# Patient Record
Sex: Male | Born: 1960 | Race: White | Hispanic: No | Marital: Single | Smoking: Former smoker
Health system: Southern US, Community
[De-identification: ages and names within clinical notes are randomized; demographics above are authoritative.]

## PROBLEM LIST (undated history)

## (undated) DIAGNOSIS — E7800 Pure hypercholesterolemia, unspecified: Secondary | ICD-10-CM

## (undated) DIAGNOSIS — Z789 Other specified health status: Secondary | ICD-10-CM

## (undated) DIAGNOSIS — I10 Essential (primary) hypertension: Secondary | ICD-10-CM

## (undated) DIAGNOSIS — F1090 Alcohol use, unspecified, uncomplicated: Secondary | ICD-10-CM

## (undated) DIAGNOSIS — M1990 Unspecified osteoarthritis, unspecified site: Secondary | ICD-10-CM

## (undated) HISTORY — DX: Other specified health status: Z78.9

## (undated) HISTORY — PX: APPENDECTOMY: SHX54

## (undated) HISTORY — DX: Alcohol use, unspecified, uncomplicated: F10.90

## (undated) HISTORY — PX: HAND SURGERY: SHX662

---

## 2001-04-21 ENCOUNTER — Ambulatory Visit (HOSPITAL_COMMUNITY): Admission: RE | Admit: 2001-04-21 | Discharge: 2001-04-21 | Payer: Self-pay | Admitting: *Deleted

## 2006-04-22 ENCOUNTER — Inpatient Hospital Stay (HOSPITAL_COMMUNITY): Admission: EM | Admit: 2006-04-22 | Discharge: 2006-04-23 | Payer: Self-pay | Admitting: Family Medicine

## 2006-04-22 ENCOUNTER — Encounter (INDEPENDENT_AMBULATORY_CARE_PROVIDER_SITE_OTHER): Payer: Self-pay | Admitting: *Deleted

## 2006-04-28 ENCOUNTER — Ambulatory Visit: Payer: Self-pay | Admitting: Internal Medicine

## 2006-05-25 ENCOUNTER — Ambulatory Visit: Payer: Self-pay | Admitting: Critical Care Medicine

## 2006-06-04 ENCOUNTER — Encounter: Admission: RE | Admit: 2006-06-04 | Discharge: 2006-06-04 | Payer: Self-pay | Admitting: Internal Medicine

## 2006-09-21 ENCOUNTER — Ambulatory Visit: Payer: Self-pay | Admitting: Internal Medicine

## 2006-09-27 ENCOUNTER — Encounter (INDEPENDENT_AMBULATORY_CARE_PROVIDER_SITE_OTHER): Payer: Self-pay | Admitting: *Deleted

## 2006-09-30 ENCOUNTER — Encounter (INDEPENDENT_AMBULATORY_CARE_PROVIDER_SITE_OTHER): Payer: Self-pay | Admitting: *Deleted

## 2010-06-20 NOTE — Discharge Summary (Signed)
[name], [name]                 ACCOUNT NO.:  [id number]   MEDICAL RECORD NO.:  [id number]          PATIENT TYPE:  INP   LOCATION:  5703                         FACILITY:  [facility]   PHYSICIAN:  Angelia Mould. Derrell Lolling, M.D.DATE OF BIRTH:  [birth year]   DATE OF ADMISSION:  04/22/2006  DATE OF DISCHARGE:  04/23/2006                               DISCHARGE SUMMARY   FINAL DIAGNOSIS:  Acute suppurative appendicitis.   OPERATION PERFORMED:  Laparoscopic appendectomy.   SURGEON:  Angelia Mould. Derrell Lolling, M.D.   OPERATIVE INDICATION:  This is a 50 year old white man with no chronic  medical problems with fairly extensive tobacco abuse.  He presented with  right lower quadrant abdominal pain to the emergency room.  CT was  performed which showed appendicitis in the retrocecal location.  I was  asked to see him and found that he had history and physical findings and  CT findings consistent with acute appendicitis.  White blood cell count  was elevated to 16,800.  He was admitted for surgical intervention.   HOSPITAL COURSE:  On day of admission, the patient was taken to  operating room and underwent a laparoscopic appendectomy.  He had acute  appendicitis.  Technically, the surgery was uneventful.  During the  procedure, he required fairly high FIO2 and had some bronchospasm which  caused concern for the anesthesiologist, but this improved by the end of  the case.  A postop chest x-ray showed suboptimal inspiration, but no  acute cardiopulmonary disease.  He had no problems with oxygen  saturation.   He did well.  He progressed in his diet and activities and was  discharged on April 23, 2006.  I discussed the pulmonary problems with  him and strongly encouraged smoking cessation and asked him to follow up  with Dr. Alwyn Ren, his primary care physician, to discussed smoking  cessation and potential pulmonary problems; he said he would do that.  He was given instructions in diet and activities and  followup  appointment with me.      Angelia Mould. Derrell Lolling, M.D.  Electronically Signed     HMI/MEDQ  D:  06/18/2006  T:  06/18/2006  Job:  [id number]

## 2010-06-20 NOTE — H&P (Signed)
NAMEKENTAVIUS, Simpson                 ACCOUNT NO.:  [id number]   MEDICAL RECORD NO.:  [id number]          PATIENT TYPE:  INP   LOCATION:  1826                         FACILITY:  [facility]   PHYSICIAN:  Angelia Mould. Derrell Lolling, M.D.DATE OF BIRTH:  05-03-1960   DATE OF ADMISSION:  04/22/2006  DATE OF DISCHARGE:                              HISTORY & PHYSICAL   CHIEF COMPLAINT:  Right lower quadrant abdominal pain.   HISTORY OF PRESENT ILLNESS:  Mr. [name] is a 50 year old male patient  with no chronic medical problems.  He developed right lower quadrant  abdominal pain around 9 O'clock on April 21, 2006.  The pain became  severe enough that it prompted him to leave work around lunchtime on  that same date.  The patient went home, tried to sleep, noticed that if  he laid on his left side his pain was increased.  He had eaten breakfast  yesterday, but since that time has really not eaten.  He has had dry  heaves and low-grade fevers.  The pain persisted until this morning, so  he finally presented to the urgent care at [facility].  Because of his  clinical exam, he was sent to the ER for further evaluation.  CT scan  was performed and does show retrocecal appendicitis without evidence of  perforation or abscess.  Surgical consultation has been requested.   REVIEW OF SYSTEMS:  As above.   PAST HISTORY:  None.   PAST SURGICAL HISTORY:  None.   FAMILY HISTORY:  Noncontributory.   SOCIAL HISTORY:  The patient does smoke cigarettes since age 50, one  pack per day.  He also drinks alcoholic beverages, usually beer, about  one case over a weeks period of time.  He is married.  His wife works as  a Designer, jewellery within the [employer] system at the outpatient  clinic.   ALLERGIES:  NKDA.   CURRENT MEDICATIONS:  None.   PHYSICAL EXAMINATION:  GENERAL:  Pleasant male patient complaining of  significant right lower quadrant abdominal pain.  VITAL SIGNS:  Temperature is 97, blood pressure is  142/94, pulse is 108,  respirations are 20.  NEURO:  The patient is alert and oriented times 3.  Moves all  extremities times 4 without focal deficit.  HEENT:  Head is normocephalic.  Sclerae non-injected.  NECK:  Supple.  No lymphadenopathy.  CHEST:  Bilateral lung sounds clear to auscultation without wheezing.  Respiratory effort is non-labored.  He is on room air.  CARDIAC:  S1, S2 without rubs, murmurs, throws, or gallops.  Pulse is  slightly tachycardic, but regular.  ABDOMEN:  Soft, slightly distended.  Bowel sounds are diminished.  He is  tender focally in the right lower quadrant with guarding and minimal  rebounding.  EXTREMITIES:  Symmetrical in appearance without edema, cyanosis, or  clubbing.  Pulses are palpable.   LABORATORY DATA:  White count 16,800, neutrophils 81%, hemoglobin 16.3,  platelets 287,000.  Sodium 134, potassium 3, CO2 23, glucose 110, BUN  10, creatinine 1.  CT of the abdomen and pelvis revealed acute  retrocecal appendicitis without perforation or abscess.   IMPRESSION:  1. Acute appendicitis.  2. Volume depletion.   PLAN:  1. Admit to surgical floor NPO status.  IV fluid hydration at 150 mL      an hour.  Empiric Zosyn IV empirically to cover enteric pathogens.  2. Plan on laparoscopic appendectomy today.  Risks and benefits of      this procedure have been discussed with the patient per Dr. Derrell Lolling      and he agrees to proceed.  3. Will offer dilotid for pain and Zofran for nausea.      Allison L. Rennis Harding, N.P.      Angelia Mould. Derrell Lolling, M.D.  Electronically Signed    ALE/MEDQ  D:  04/22/2006  T:  04/22/2006  Job:  [id number]

## 2010-06-20 NOTE — Cardiovascular Report (Signed)
[place]. [facility]  Patient:    [name], [name] Visit Number: [id number] MRN: [id number]          Service Type: CAT Location: [facility] 2860 01 Attending Physician:  Daisey Must Dictated by:   Veneda Melter, M.D. [facility] Proc. Date: 04/21/01 Admit Date:  04/21/2001 Discharge Date: 04/21/2001   CC:         Nathen May, M.D., [facility]  Titus Dubin. Alwyn Ren, M.D. [facility]   Cardiac Catheterization  PROCEDURE: 1. Left heart catheterization. 2. Left ventriculogram. 3. Selective coronary angiography. 4. Perclose of the right femoral artery.  DIAGNOSES: 1. Trivial coronary artery disease by angiogram. 2. Normal left ventricular systolic function.  INDICATIONS:  [name] is a 50 year old white male with a strong family history of coronary artery disease and history of tobacco use who presents with neck discomfort.  The patient underwent stress test in which he had nonsustained ventricular tachycardia and bundle branch block.  He presents for further cardiac assessment.  DESCRIPTION OF PROCEDURE:  Informed consent was obtained.  The patient was brought to the catheterization lab.  A 6 French sheath was placed in the right femoral artery using a modified Seldinger technique.  6 [place] and JR4 catheters were then used to engage the left and right coronary arteries. Selective angiography performed in various projections using manual injections of contrast.  A 6 French pigtail catheter was advanced to the left ventricle and left ventriculogram performed using power injections of contrast.  At the termination of the case, a Perclose suture closure device was deployed to the right femoral artery until adequate hemostasis was achieved.  The patient tolerated the procedure well and was transferred to the floor in stable condition.  FINDINGS: 1. Left main trunk angiographically normal. 2. LAD.  This is a medium caliber vessel that provides two diagonal  branches.    The LAD has luminal irregularities in the midsection. 3. Left circumflex artery is a medium caliber vessel that provides small    first and third marginal branches and a large second marginal branch in the    midsection.  The left circumflex system has luminal irregularities. 4. The right coronary artery is dominant.  This is a medium caliber vessel    that provides posterior descending artery and posterior ventricular branch    of the terminal segment. The right coronary artery has luminal    irregularities in the midsection. 5. LV; normal end systolic and end diastolic dimensions.  Overall left    ventricular function is well preserved.  Ejection fraction is greater than    65%. No mitral regurgitation.  LV pressure is 120/0, aortic is 120/70,    LVEDP equals 15.  ASSESSMENT:  [name] is a 50 year old gentleman with noncritical coronary artery disease and well preserved LV function.  Continued medical therapy will be persued. Dictated by:   Veneda Melter, M.D. [facility] Attending Physician:  Daisey Must DD:  04/21/01 TD:  04/24/01 Job: [id number]

## 2010-06-20 NOTE — Op Note (Signed)
[name], [name]                 ACCOUNT NO.:  [id number]   MEDICAL RECORD NO.:  [id number]          PATIENT TYPE:  INP   LOCATION:  1826                         FACILITY:  [facility]   PHYSICIAN:  Angelia Mould. Derrell Lolling, M.D.DATE OF BIRTH:  1961/01/04   DATE OF PROCEDURE:  04/22/2006  DATE OF DISCHARGE:                               OPERATIVE REPORT   PREOPERATIVE DIAGNOSIS:  Acute appendicitis.   POSTOPERATIVE DIAGNOSIS:  Acute appendicitis.   OPERATION PERFORMED:  Laparoscopic appendectomy.   SURGEON:  Angelia Mould. Derrell Lolling, M.D.   OPERATIVE INDICATIONS:  This is a 50 year old white male who presents  with a 24-hour history of right lower quadrant abdominal pain and  nausea.  He has never had symptoms like this before.  CT scan shows  acute appendicitis with the appendix somewhat in a retrocecal location  and extending laterally.  No other abnormalities were noted on the CT.  No abscess or perforation.  He was evaluated in the emergency department  and advised to have appendectomy.  He agrees with that plan and is  brought to the operating room urgently.   OPERATIVE FINDINGS:  The patient had acute appendicitis.  The appendix  was partially retrocecal and partially lateral to the cecum.  The  ascending colon and terminal ileum looked fine.  The omentum was stuck  to this process slightly but was easy to dissect away.   OPERATIVE TECHNIQUE:  Following the induction of general endotracheal  anesthesia, a Foley catheter was placed.  The abdomen was prepped and  draped in a sterile fashion.  The patient had received intravenous  antibiotics in the emergency department.  The patient was identified as  to the correct patient and the correct procedure.  0.5% Marcaine with  epinephrine was used as a local infiltration anesthetic.  A 12 mm OptiVu  port was placed in the left rectus muscle just above the umbilicus.  This insertion was fairly smooth and atraumatic.  Pneumoperitoneum was  created.  The video camera was inserted with visualization and findings  as described above.  A 5 mm trocar was placed in the left rectus muscle  just below the umbilicus and a 12 mm trocar was placed in the left  suprapubic area.   The cecum and appendix were fairly stuck to each other laterally.  I  chose to divide the lateral peritoneal attachments and mobilize the  terminal ileum and cecum medially.  With this, I was able to identify  the tip of the appendix and peel it off of the cecum and then identified  the mesentery of the appendix.  Using the harmonic scalpel, I divided  the mesentery of the appendix and the appendiceal artery.  I did this in  steps until I had the appendix completely skeletonized and could  identify the junction of the appendix with the cecum.  I placed an Endo-  GIA stapler and placed it transversely across the appendix and the base  of the cecum, closed the stapler, held it in place for about 20 seconds,  fired it, and removed it.  The appendix was  placed in a specimen bag and  removed.   The operative field was irrigated.  There was no bleeding.  I carefully  inspected the staple line and it appeared quite secure.  I was satisfied  with this.  I evacuated all the fluid.  There was no bleeding.  I took  some of the omentum and then tucked it on top of the cecum and the  staple line.  The trocars were removed under direct vision.  There was  no bleeding from the trocar sites.  The pneumoperitoneum was released.  The skin incisions were closed with  subcuticular sutures of 4-0 Monocryl and Steri-Strips.  Clean bandages  were placed and the patient was taken to the recovery room in stable  condition.  Estimated blood loss was about 15 mL.  Complications were  none.  Sponge, needle, and instrument counts were correct.      Angelia Mould. Derrell Lolling, M.D.  Electronically Signed     HMI/MEDQ  D:  04/22/2006  T:  04/22/2006  Job:  [id number]   cc:   Charlaine Dalton. Sherene Sires,  MD, FCCP

## 2010-06-20 NOTE — Letter (Signed)
April 28, 2006    [name]. [name], M.D.  [address], [address]  [place], [place] [address]   RE:  [name], [name]  MRN:  [id number]  /  DOB:  1960/03/13   Dear [name]:   Thank you for referring [name] for evaluation of his pulmonary status.  He states that he had atelectasis at the time of the appendectomy with  apparent respiratory compromise.  Since discharge he has not smoked.  Initially he had a productive cough, but that is improving off  cigarettes.  He continues to use incentive spirometry at home with  stable tidal volumes.   He has no past history of pulmonary disease.  He had a cardiac cath  in  2003 for neck discomfort & non sustained ventricular tachycardia and  bundle branch block @ nuclear stress test.  Significantly, his father  had a heart attack in his 30s.  That catheterization showed minimal  trivial coronary artery disease.   Additionally, at that time he was found to have dyslipidemia and  nutritional changes were recommended.  Unfortunately, he did continue to  smoke and, I ate whatever, even though he is married to a  nutritionist.   At this time he has no active cardiopulmonary symptoms.  He has residual  soreness from the laparoscopic appendectomy.   He is on no medications, has no known allergies.   PHYSICAL EXAMINATION:  VITAL SIGNS:  Weight was 208, pulse 61 and  regular, respiratory rate 16, blood pressure 110/80.  CARDIAC:  He has no carotid bruits; an S4 is present without murmurs.  All pulses are intact, and he has no edema.  CHEST:  Clear to auscultation, and there is no increased work of  breathing.  ABDOMEN:  Still distended and tender.  The laparoscopic sites are well-  healed.   He did have some bilateral atelectasis on portable chest film of March  20 in the hospital.   I will recommend that he continue the incentive spirometer or blow up  balloons @ least 6 balloons per day while away at home.  I will schedule  pulmonary function  tests to establish a baseline.  Additionally, I will  schedule a follow-up chest x-ray at [name] in several weeks.   Because of his family history of diabetes and coronary artery disease  which is profound in the paternal family, I will recommend that he have  an NMR LipoProfile panel done in July after several months of ongoing  cardiovascular exercise and dietary change.  I would recommend the book  by Dr. Beckey Simpson, Eat, Drink and Be Healthy, as well as the information  in [employer]'s Flat Belly Diet.  His glucose was 110 during  hospitalization, which may reflect the IV fluids, but I will also check  an A1c in August.   He is a nonsmoker at this time but requested that I prescribe Chantix.    Sincerely,      [name]. Phillip Ren, MD,FACP,FCCP  Electronically Signed    [facility]/MedQ  DD: 04/28/2006  DT: 04/28/2006  Job #: [id number]

## 2010-12-31 ENCOUNTER — Other Ambulatory Visit: Payer: Self-pay | Admitting: Sports Medicine

## 2010-12-31 DIAGNOSIS — M25571 Pain in right ankle and joints of right foot: Secondary | ICD-10-CM

## 2011-01-02 ENCOUNTER — Ambulatory Visit
Admission: RE | Admit: 2011-01-02 | Discharge: 2011-01-02 | Disposition: A | Discharge status: Home / Self Care | Payer: BC Managed Care – PPO | Source: Ambulatory Visit | Attending: Sports Medicine | Admitting: Sports Medicine

## 2011-01-02 DIAGNOSIS — M25571 Pain in right ankle and joints of right foot: Secondary | ICD-10-CM

## 2011-03-30 ENCOUNTER — Encounter (HOSPITAL_BASED_OUTPATIENT_CLINIC_OR_DEPARTMENT_OTHER): Payer: Self-pay | Admitting: *Deleted

## 2011-03-30 ENCOUNTER — Encounter (HOSPITAL_BASED_OUTPATIENT_CLINIC_OR_DEPARTMENT_OTHER)
Admission: RE | Admit: 2011-03-30 | Discharge: 2011-03-30 | Disposition: A | Discharge status: Home / Self Care | Payer: BC Managed Care – PPO | Source: Ambulatory Visit | Attending: Orthopedic Surgery | Admitting: Orthopedic Surgery

## 2011-03-31 ENCOUNTER — Other Ambulatory Visit: Payer: Self-pay

## 2011-03-31 ENCOUNTER — Encounter (HOSPITAL_BASED_OUTPATIENT_CLINIC_OR_DEPARTMENT_OTHER)
Admission: RE | Admit: 2011-03-31 | Discharge: 2011-03-31 | Disposition: A | Discharge status: Home / Self Care | Payer: BC Managed Care – PPO | Source: Ambulatory Visit | Attending: Orthopedic Surgery | Admitting: Orthopedic Surgery

## 2011-03-31 ENCOUNTER — Encounter (HOSPITAL_BASED_OUTPATIENT_CLINIC_OR_DEPARTMENT_OTHER): Admission: RE | Admit: 2011-03-31 | Payer: BC Managed Care – PPO | Source: Ambulatory Visit

## 2011-03-31 LAB — BASIC METABOLIC PANEL
BUN: 12 mg/dL (ref 6–23)
CO2: 24 mEq/L (ref 19–32)
Calcium: 9.9 mg/dL (ref 8.4–10.5)
Creatinine, Ser: 0.75 mg/dL (ref 0.50–1.35)
GFR calc non Af Amer: >90 mL/min (ref >90)
Glucose, Bld: 108 mg/dL — ABNORMAL HIGH (ref 70–99)

## 2011-04-01 NOTE — H&P (Signed)
Yulitza Shorts/[facility] [address]   [address] [place], [place] [address] [phone] A Division of [facility]  Loreta Ave, M.D.     Robert A. Thurston Hole, M.D.     Lunette Stands, M.D. Eulas Post, M.D.    Buford Dresser, M.D. Estell Harpin, M.D. Ralene Cork, D.O.          Genene Churn. Barry Dienes, PA-C            Kirstin A. Shepperson, PA-C Janace Litten, OPA-C  RE: Lydia, Meng   [id number]       DOB: [birth year] PROGRESS NOTE: 12-23-10 SUBJECTIVE: [name] is a 51 year old who comes in with concerns about his right ankle. 6 weeks ago he rolled his ankle felt a pop and with continued pain since then he comes in for evaluation. The more he stands and walks the more pain he has. A feeling of intermittent giving way. Some initial swelling and bruising that's resolved. No night or rest pain. No popping or mechanical symptoms. No previous significant problems with the ankle. He's otherwise healthy no known drug allergies no chronic medications. Past surgical history social history and review of systems is noted updated and reviewed.  OBJECTIVE: 5'11" 196 pounds. Blood pressure 140/96. Sits comfortably in exam room. He can weight bear on the right lower extremity with a limp. 1+ drawer 2+ tilt pain over the peroneal tendon. Non-tender in the fibula and syndesmosis. Achilles is intact the ankle is stable and he's neurovascularly intact distally. Non-tender 5th metatarsal.  X-RAYS: 2 views of the foot and ankle are unremarkable.  ASSESSMENT: Right ankle peroneal tendon strain possible peroneal tendon rupture.  PLAN: I'm going to place him in a Cam Walker. 2-3 weeks of immobilization with intermittent ice and ibuprofen. Follow-up at that time if symptoms persist MRI to evaluate his peroneal tendon.  Estell Harpin, M.D.  Electronically verified by Estell Harpin, M.D. JSK:kh D 12-26-10 T 12-29-10   Gayleen Sholtz/[facility] [address]   [address] [place], [place] [address] [phone] A Division of [facility]  Loreta Ave, M.D.     Robert A. Thurston Hole, M.D.     Lunette Stands, M.D. Eulas Post, M.D.    Buford Dresser, M.D. Estell Harpin, M.D. Ralene Cork, D.O.          Genene Churn. Barry Dienes, PA-C            Kirstin A. Shepperson, PA-C Janace Litten, OPA-C   RE: Noell, Shular                                [id number]      DOB: [birth year] PROGRESS NOTE: 03-17-11 [name] returns for follow up for his right ankle and lateral foot.  He continues to have significant pain and soreness lateral side.  He has been in a boot.  He is continuing to work.  He doesn't do too bad when he is in his boot, but as soon as he comes out of that symptoms get worse.  Even some pain while in the boot.  The vast majority of his symptoms lateral side along the peroneal tendons lateral ankle.  He has also gotten a little soreness anterolaterally at the ankle, as well as under the foot medial side by his spring ligament.  Getting frustrated, as he is now over three  months out from his initial injury.  At that time he had an inversion type injury, but the vast majority of his symptoms were down over the peroneal tendons.  Some lateral ligamentous issues.  At this point in time he feels like he is getting worse rather than better.  He would like to pursue more definitive treatment.  I have reviewed his history, workup and treatment to date.  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  This is just past the tip of the fibula.  There is also some ligamentous strain to the calcaneal navicular ligament.  Also looking at the ankle, question of a meniscoid lesion, although ligaments there look good and there is no osteochondral lesions.  His issue is pain  over the peroneal tendons.  Really denying anything that sounds like subluxation of the tendons behind the fibula.  He does have a history of an ankle fracture when he was 14, that did go on to heal and he has had no problems with that.    EXAMINATION: General exam today is updated, reviewed and included in the chart.  Specifically, I can still get him to go through fairly good motion with his ankle and foot.  He does have soreness anterolaterally in his ankle, but not much medially.  A little sore over the spring ligament on the bottom of his foot, but not back at the plantar fascia attachment.  No fixed deformities of any joints.  Peroneal tendons do not sublux, but he is exquisitely tender over the peroneus brevis and longus just distal to the fibula along the lateral side of the foot.  These work, but as soon as you try to resist them it causes marked pain.  Drawer and tilt test of his ankle really do not demonstrate significant instability.    DISPOSITION:  We had a long talk about his injury, workup and treatment.  This is now approaching four months and he is just treading water, getting worse rather than better.  I have told him that I was hopeful that the irritation and longitudinal injury to the peroneal tendons would settle down without treatment, but that does not look like the case.  We have discussed exam under anesthesia, arthroscopy, assessment of his ankle with debridement of what I think is probably a meniscoid lesion.  Open exploration, debridement of the peroneal tendons with repair of the longitudinal split tear and debridement of the tenosynovitis.  Procedures, risks, benefits and complications reviewed.  I have told him that he has the potential to be in a boot to protect this area for six weeks post-op before proceeding with rehab.  Understands and agrees.  Paperwork complete.  All questions answered.  I will see him at the time of operative intervention.    Loreta Ave, M.D.     Electronically verified by Loreta Ave, M.D. DFM:jjh D 03-18-11 T 03-19-11

## 2011-04-02 ENCOUNTER — Encounter (HOSPITAL_BASED_OUTPATIENT_CLINIC_OR_DEPARTMENT_OTHER): Payer: Self-pay | Admitting: Anesthesiology

## 2011-04-02 ENCOUNTER — Encounter (HOSPITAL_BASED_OUTPATIENT_CLINIC_OR_DEPARTMENT_OTHER): Payer: Self-pay | Admitting: *Deleted

## 2011-04-02 ENCOUNTER — Ambulatory Visit (HOSPITAL_BASED_OUTPATIENT_CLINIC_OR_DEPARTMENT_OTHER)
Admission: RE | Admit: 2011-04-02 | Discharge: 2011-04-02 | Disposition: A | Discharge status: Home / Self Care | Payer: BC Managed Care – PPO | Source: Ambulatory Visit | Attending: Orthopedic Surgery | Admitting: Orthopedic Surgery

## 2011-04-02 ENCOUNTER — Ambulatory Visit (HOSPITAL_BASED_OUTPATIENT_CLINIC_OR_DEPARTMENT_OTHER): Payer: BC Managed Care – PPO | Admitting: Anesthesiology

## 2011-04-02 ENCOUNTER — Encounter (HOSPITAL_BASED_OUTPATIENT_CLINIC_OR_DEPARTMENT_OTHER)
Admission: RE | Disposition: A | Discharge status: Home / Self Care | Payer: Self-pay | Source: Ambulatory Visit | Attending: Orthopedic Surgery

## 2011-04-02 DIAGNOSIS — M659 Synovitis and tenosynovitis, unspecified: Secondary | ICD-10-CM | POA: Insufficient documentation

## 2011-04-02 DIAGNOSIS — M25879 Other specified joint disorders, unspecified ankle and foot: Secondary | ICD-10-CM | POA: Insufficient documentation

## 2011-04-02 DIAGNOSIS — M65979 Unspecified synovitis and tenosynovitis, unspecified ankle and foot: Secondary | ICD-10-CM | POA: Insufficient documentation

## 2011-04-02 DIAGNOSIS — Z01812 Encounter for preprocedural laboratory examination: Secondary | ICD-10-CM | POA: Insufficient documentation

## 2011-04-02 DIAGNOSIS — Z01810 Encounter for preprocedural cardiovascular examination: Secondary | ICD-10-CM | POA: Insufficient documentation

## 2011-04-02 DIAGNOSIS — I10 Essential (primary) hypertension: Secondary | ICD-10-CM | POA: Insufficient documentation

## 2011-04-02 DIAGNOSIS — Z4789 Encounter for other orthopedic aftercare: Secondary | ICD-10-CM

## 2011-04-02 HISTORY — DX: Pure hypercholesterolemia, unspecified: E78.00

## 2011-04-02 HISTORY — DX: Essential (primary) hypertension: I10

## 2011-04-02 HISTORY — PX: ANKLE RECONSTRUCTION: SHX1151

## 2011-04-02 HISTORY — DX: Unspecified osteoarthritis, unspecified site: M19.90

## 2011-04-02 LAB — POCT HEMOGLOBIN-HEMACUE: Hemoglobin: 14.9 g/dL (ref 13.0–17.0)

## 2011-04-02 SURGERY — RECONSTRUCTION, ANKLE
Anesthesia: General | Site: Ankle | Laterality: Right | Wound class: Clean

## 2011-04-02 MED ORDER — MORPHINE SULFATE 4 MG/ML IJ SOLN: 0.0500 mg/kg | INTRAMUSCULAR | Status: DC | PRN

## 2011-04-02 MED ORDER — FENTANYL CITRATE 0.05 MG/ML IJ SOLN: 25.0000 ug | INTRAMUSCULAR | Status: DC | PRN

## 2011-04-02 MED ORDER — DEXAMETHASONE SODIUM PHOSPHATE 4 MG/ML IJ SOLN
INTRAMUSCULAR | Status: DC | PRN
  Administered 2011-04-02: 10 mg via INTRAVENOUS

## 2011-04-02 MED ORDER — LIDOCAINE HCL 1 % IJ SOLN
INTRAMUSCULAR | Status: DC | PRN
  Administered 2011-04-02: 2 mL via INTRADERMAL

## 2011-04-02 MED ORDER — CEFAZOLIN SODIUM 1-5 GM-% IV SOLN
1.0000 g | INTRAVENOUS | Status: AC
  Administered 2011-04-02: 2 g via INTRAVENOUS

## 2011-04-02 MED ORDER — BUPIVACAINE HCL (PF) 0.5 % IJ SOLN
INTRAMUSCULAR | Status: DC | PRN
  Administered 2011-04-02: 20 mL

## 2011-04-02 MED ORDER — LACTATED RINGERS IV SOLN
INTRAVENOUS | Status: DC
  Administered 2011-04-02 (×2): via INTRAVENOUS

## 2011-04-02 MED ORDER — CEFAZOLIN SODIUM 1-5 GM-% IV SOLN: 1.0000 g | INTRAVENOUS | Status: DC

## 2011-04-02 MED ORDER — LIDOCAINE-EPINEPHRINE 1.5-1:200000 % IJ SOLN
INTRAMUSCULAR | Status: DC | PRN
  Administered 2011-04-02: 25 mL via INTRADERMAL

## 2011-04-02 MED ORDER — MIDAZOLAM HCL 2 MG/2ML IJ SOLN
0.5000 mg | INTRAMUSCULAR | Status: DC | PRN
  Administered 2011-04-02: 2 mg via INTRAVENOUS

## 2011-04-02 MED ORDER — ROPIVACAINE HCL 5 MG/ML IJ SOLN
INTRAMUSCULAR | Status: DC | PRN
  Administered 2011-04-02: 25 mL via EPIDURAL

## 2011-04-02 MED ORDER — FENTANYL CITRATE 0.05 MG/ML IJ SOLN
50.0000 ug | INTRAMUSCULAR | Status: DC | PRN
  Administered 2011-04-02: 100 ug via INTRAVENOUS

## 2011-04-02 MED ORDER — PROPOFOL 10 MG/ML IV EMUL
INTRAVENOUS | Status: DC | PRN
  Administered 2011-04-02: 300 mg via INTRAVENOUS

## 2011-04-02 MED ORDER — ONDANSETRON HCL 4 MG/2ML IJ SOLN
INTRAMUSCULAR | Status: DC | PRN
  Administered 2011-04-02: 4 mg via INTRAVENOUS

## 2011-04-02 MED ORDER — LIDOCAINE HCL (CARDIAC) 20 MG/ML IV SOLN
INTRAVENOUS | Status: DC | PRN
  Administered 2011-04-02: 50 mg via INTRAVENOUS

## 2011-04-02 MED ORDER — EPHEDRINE SULFATE 50 MG/ML IJ SOLN
INTRAMUSCULAR | Status: DC | PRN
  Administered 2011-04-02 (×2): 15 mg via INTRAVENOUS

## 2011-04-02 MED ORDER — METOCLOPRAMIDE HCL 5 MG/ML IJ SOLN: 10.0000 mg | Freq: Once | INTRAMUSCULAR | Status: DC | PRN

## 2011-04-02 SURGICAL SUPPLY — 63 items
APL SKNCLS STERI-STRIP NONHPOA (GAUZE/BANDAGES/DRESSINGS)
BANDAGE ELASTIC 4 VELCRO ST LF (GAUZE/BANDAGES/DRESSINGS) ×2 IMPLANT
BANDAGE ELASTIC 6 VELCRO ST LF (GAUZE/BANDAGES/DRESSINGS) ×2 IMPLANT
BANDAGE ESMARK 6X9 LF (GAUZE/BANDAGES/DRESSINGS) ×1 IMPLANT
BENZOIN TINCTURE PRP APPL 2/3 (GAUZE/BANDAGES/DRESSINGS) IMPLANT
BLADE AVERAGE 25X9 (BLADE) IMPLANT
BLADE OSC/SAG .038X5.5 CUT EDG (BLADE) IMPLANT
BLADE SURG 15 STRL LF DISP TIS (BLADE) ×1 IMPLANT
BLADE SURG 15 STRL SS (BLADE) ×2
BNDG CMPR 9X6 STRL LF SNTH (GAUZE/BANDAGES/DRESSINGS) ×1
BNDG COHESIVE 4X5 TAN STRL (GAUZE/BANDAGES/DRESSINGS) ×2 IMPLANT
BNDG ESMARK 6X9 LF (GAUZE/BANDAGES/DRESSINGS) ×2
CANISTER SUCTION 1200CC (MISCELLANEOUS) IMPLANT
CLOTH BEACON ORANGE TIMEOUT ST (SAFETY) ×2 IMPLANT
COVER TABLE BACK 60X90 (DRAPES) ×2 IMPLANT
CUFF TOURNIQUET SINGLE 34IN LL (TOURNIQUET CUFF) ×2 IMPLANT
DECANTER SPIKE VIAL GLASS SM (MISCELLANEOUS) IMPLANT
DRAPE EXTREMITY T 121X128X90 (DRAPE) ×2 IMPLANT
DRAPE OEC MINIVIEW 54X84 (DRAPES) IMPLANT
DRAPE U 20/CS (DRAPES) ×2 IMPLANT
DRAPE U-SHAPE 47X51 STRL (DRAPES) ×2 IMPLANT
DURAPREP 26ML APPLICATOR (WOUND CARE) ×2 IMPLANT
ELECT NEEDLE TIP 2.8 STRL (NEEDLE) ×2 IMPLANT
ELECT REM PT RETURN 9FT ADLT (ELECTROSURGICAL) ×2
ELECTRODE REM PT RTRN 9FT ADLT (ELECTROSURGICAL) ×1 IMPLANT
GAUZE XEROFORM 1X8 LF (GAUZE/BANDAGES/DRESSINGS) ×2 IMPLANT
GLOVE BIO SURGEON STRL SZ 6.5 (GLOVE) ×6 IMPLANT
GLOVE BIOGEL PI IND STRL 7.0 (GLOVE) ×2 IMPLANT
GLOVE BIOGEL PI IND STRL 8 (GLOVE) ×1 IMPLANT
GLOVE BIOGEL PI INDICATOR 7.0 (GLOVE) ×2
GLOVE BIOGEL PI INDICATOR 8 (GLOVE) ×1
GLOVE ORTHO TXT STRL SZ7.5 (GLOVE) ×4 IMPLANT
GOWN BRE IMP PREV XXLGXLNG (GOWN DISPOSABLE) ×2 IMPLANT
GOWN PREVENTION PLUS XLARGE (GOWN DISPOSABLE) ×8 IMPLANT
NEEDLE HYPO 25X1 1.5 SAFETY (NEEDLE) IMPLANT
NS IRRIG 1000ML POUR BTL (IV SOLUTION) IMPLANT
PACK BASIN DAY SURGERY FS (CUSTOM PROCEDURE TRAY) ×2 IMPLANT
PAD CAST 3X4 CTTN HI CHSV (CAST SUPPLIES) IMPLANT
PAD CAST 4YDX4 CTTN HI CHSV (CAST SUPPLIES) ×2 IMPLANT
PADDING CAST COTTON 3X4 STRL (CAST SUPPLIES)
PADDING CAST COTTON 4X4 STRL (CAST SUPPLIES) ×2
PENCIL BUTTON HOLSTER BLD 10FT (ELECTRODE) ×2 IMPLANT
SET ARTHROSCOPY TUBING (MISCELLANEOUS) ×2
SET ARTHROSCOPY TUBING LN (MISCELLANEOUS) ×1 IMPLANT
SLEEVE SCD COMPRESS KNEE MED (MISCELLANEOUS) IMPLANT
SPONGE GAUZE 4X4 12PLY (GAUZE/BANDAGES/DRESSINGS) ×2 IMPLANT
SPONGE LAP 4X18 X RAY DECT (DISPOSABLE) IMPLANT
STOCKINETTE 4X48 STRL (DRAPES) ×2 IMPLANT
SUCTION FRAZIER TIP 10 FR DISP (SUCTIONS) IMPLANT
SUT ETHIBOND 2 OS 4 DA (SUTURE) IMPLANT
SUT ETHILON 3 0 PS 1 (SUTURE) ×2 IMPLANT
SUT VIC AB 0 SH 27 (SUTURE) ×2 IMPLANT
SUT VIC AB 2-0 SH 27 (SUTURE) ×4
SUT VIC AB 2-0 SH 27XBRD (SUTURE) ×2 IMPLANT
SUT VIC AB 3-0 SH 27 (SUTURE) ×2
SUT VIC AB 3-0 SH 27X BRD (SUTURE) ×1 IMPLANT
SUT VICRYL 4-0 PS2 18IN ABS (SUTURE) IMPLANT
SYR BULB 3OZ (MISCELLANEOUS) ×2 IMPLANT
SYR CONTROL 10ML LL (SYRINGE) ×2 IMPLANT
TUBE CONNECTING 20X1/4 (TUBING) IMPLANT
UNDERPAD 30X30 INCONTINENT (UNDERPADS AND DIAPERS) ×2 IMPLANT
WATER STERILE IRR 1000ML POUR (IV SOLUTION) ×2 IMPLANT
YANKAUER SUCT BULB TIP NO VENT (SUCTIONS) IMPLANT

## 2011-04-02 NOTE — Anesthesia Postprocedure Evaluation (Signed)
Anesthesia Post Note  Patient: [name]  Procedure(s) Performed: Procedure(s) (LRB): RECONSTRUCTION ANKLE (Right)  Anesthesia type: General  Patient location: PACU  Post pain: Pain level controlled  Post assessment: Patient's Cardiovascular Status Stable  Last Vitals:  Filed Vitals:   04/02/11 1207  BP: 138/96  Pulse: 72  Temp: 36.6 C  Resp: 18    Post vital signs: Reviewed and stable  Level of consciousness: alert  Complications: No apparent anesthesia complications

## 2011-04-02 NOTE — Discharge Instructions (Addendum)
Regional Anesthesia Blocks  1. Numbness or the inability to move the "blocked" extremity may last from 3-48 hours after placement. The length of time depends on the medication injected and your individual response to the medication. If the numbness is not going away after 48 hours, call your surgeon.  2. The extremity that is blocked will need to be protected until the numbness is gone and the  Strength has returned. Because you cannot feel it, you will need to take extra care to avoid injury. Because it may be weak, you may have difficulty moving it or using it. You may not know what position it is in without looking at it while the block is in effect.  3. For blocks in the legs and feet, returning to weight bearing and walking needs to be done carefully. You will need to wait until the numbness is entirely gone and the strength has returned. You should be able to move your leg and foot normally before you try and bear weight or walk. You will need someone to be with you when you first try to ensure you do not fall and possibly risk injury.  4. Bruising and tenderness at the needle site are common side effects and will resolve in a few days.  5. Persistent numbness or new problems with movement should be communicated to the surgeon or the [facility] ([phone]). [facility]  [address] [place],  [address] [phone]   Post Anesthesia Home Care Instructions  Activity: Get plenty of rest for the remainder of the day. A responsible adult should stay with you for 24 hours following the procedure.  For the next 24 hours, DO NOT: -Drive a car -Operate machinery -Drink alcoholic beverages -Take any medication unless instructed by your physician -Make any legal decisions or sign important papers.  Meals: Start with liquid foods such as gelatin or soup. Progress to regular foods as tolerated. Avoid greasy, spicy, heavy foods. If nausea and/or  vomiting occur, drink only clear liquids until the nausea and/or vomiting subsides. Call your physician if vomiting continues.  Special Instructions/Symptoms: Your throat may feel dry or sore from the anesthesia or the breathing tube placed in your throat during surgery. If this causes discomfort, gargle with warm salt water. The discomfort should disappear within 24 hours.   

## 2011-04-02 NOTE — Transfer of Care (Signed)
Immediate Anesthesia Transfer of Care Note  Patient: [name]  Procedure(s) Performed: Procedure(s) (LRB): RECONSTRUCTION ANKLE (Right)  Patient Location: PACU  Anesthesia Type: General  Level of Consciousness: awake, alert  and oriented  Airway & Oxygen Therapy: Patient Spontanous Breathing and Patient connected to face mask oxygen  Post-op Assessment: Report given to PACU RN and Post -op Vital signs reviewed and stable  Post vital signs: Reviewed and stable  Complications: No apparent anesthesia complications

## 2011-04-02 NOTE — Progress Notes (Signed)
Assisted Dr. Frederick with right, ultrasound guided, popliteal/saphenous block. Side rails up, monitors on throughout procedure. See vital signs in flow sheet. Tolerated Procedure well. 

## 2011-04-02 NOTE — Anesthesia Preprocedure Evaluation (Signed)
Anesthesia Evaluation  Patient identified by MRN, date of birth, ID band Patient awake    Reviewed: Allergy & Precautions, H&P , NPO status , Patient's Chart, lab work & pertinent test results, reviewed documented beta blocker date and time   Airway Mallampati: II TM Distance: >3 FB Neck ROM: full    Dental   Pulmonary neg pulmonary ROS,          Cardiovascular hypertension, On Medications     Neuro/Psych Negative Neurological ROS  Negative Psych ROS   GI/Hepatic negative GI ROS, Neg liver ROS,   Endo/Other  Negative Endocrine ROS  Renal/GU negative Renal ROS  Genitourinary negative   Musculoskeletal   Abdominal   Peds  Hematology negative hematology ROS (+)   Anesthesia Other Findings See surgeon's H&P   Reproductive/Obstetrics negative OB ROS                          Anesthesia Physical Anesthesia Plan  ASA: II  Anesthesia Plan: General   Post-op Pain Management:    Induction: Intravenous  Airway Management Planned: LMA  Additional Equipment:   Intra-op Plan:   Post-operative Plan: Extubation in OR  Informed Consent: I have reviewed the patients History and Physical, chart, labs and discussed the procedure including the risks, benefits and alternatives for the proposed anesthesia with the patient or authorized representative who has indicated his/her understanding and acceptance.     Plan Discussed with: CRNA and Surgeon  Anesthesia Plan Comments:        Anesthesia Quick Evaluation  

## 2011-04-02 NOTE — Interval H&P Note (Signed)
History and Physical Interval Note:  04/02/2011 7:24 AM  [name]  has presented today for surgery, with the diagnosis of right ankle rupture tendon  The various methods of treatment have been discussed with the patient and family. After consideration of risks, benefits and other options for treatment, the patient has consented to  Procedure(s) (LRB): REPAIR PERONEAL TENDONS ANKLE (Right) as a surgical intervention .  The patients' history has been reviewed, patient examined, no change in status, stable for surgery.  I have reviewed the patients' chart and labs.  Questions were answered to the patient's satisfaction.     [name]

## 2011-04-02 NOTE — Anesthesia Procedure Notes (Addendum)
Anesthesia Regional Block:  Popliteal block  Pre-Anesthetic Checklist: ,, timeout performed, Correct Patient, Correct Site, Correct Laterality, Correct Procedure, Correct Position, site marked, Risks and benefits discussed,  Surgical consent,  Pre-op evaluation,  At surgeon's request and post-op pain management  Laterality: Right  Prep: chloraprep       Needles:   Needle Type: Other   (Arrow Echogenic)   Needle Length: 9cm  Needle Gauge: 21    Additional Needles:  Procedures: ultrasound guided Popliteal block Narrative:  Start time: 04/02/2011 8:44 AM End time: 04/02/2011 8:52 AM Injection made incrementally with aspirations every 5 mL.  Performed by: Personally  Anesthesiologist: Aldona Lento, MD  Additional Notes: Ultrasound guidance used to: id relevant anatomy, confirm needle position, local anesthetic spread, avoidance of vascular puncture. Picture saved. No complications. Block performed personally by Janetta Hora. Frederick, MD  .    Popliteal block Procedure Name: LMA Insertion Performed by: Sharyne Richters Pre-anesthesia Checklist: Patient identified, Timeout performed, Emergency Drugs available and Patient being monitored Patient Re-evaluated:Patient Re-evaluated prior to inductionOxygen Delivery Method: Circle system utilized Preoxygenation: Pre-oxygenation with 100% oxygen Intubation Type: IV induction Ventilation: Mask ventilation without difficulty LMA: LMA inserted LMA Size: 4.0 Number of attempts: 1 Placement Confirmation: breath sounds checked- equal and bilateral and positive ETCO2 Tube secured with: Tape Dental Injury: Teeth and Oropharynx as per pre-operative assessment

## 2011-04-02 NOTE — Brief Op Note (Signed)
04/02/2011  10:40 AM  PATIENT:  [name]  51 y.o. male  PRE-OPERATIVE DIAGNOSIS:  right ankle rupture tendon  POST-OPERATIVE DIAGNOSIS:  same as preop  PROCEDURE:  Procedure(s) (LRB): RECONSTRUCTION lateral ANKLE (Right), scope with debridement  SURGEON:  Surgeon(s) and Role:    * Loreta Ave, MD - Primary  PHYSICIAN ASSISTANT: Zonia Kief M     ANESTHESIA:   regional and general  EBL:  Total I/O In: 1800 [I.V.:1800] Out: -   SPECIMEN:  No Specimen  DISPOSITION OF SPECIMEN:  N/A  COUNTS:  YES  TOURNIQUET:   Total Tourniquet Time Documented: Thigh (Right) - 60 minutes  PATIENT DISPOSITION:  PACU - hemodynamically stable.

## 2011-04-03 ENCOUNTER — Encounter (HOSPITAL_BASED_OUTPATIENT_CLINIC_OR_DEPARTMENT_OTHER): Payer: Self-pay | Admitting: Orthopedic Surgery

## 2011-04-03 NOTE — Op Note (Signed)
NAME:  [name], [name] NO.:  MEDICAL RECORD NO.:  [id number]  LOCATION:                                 FACILITY:  PHYSICIAN:  Loreta Ave, M.D.      DATE OF BIRTH:  DATE OF PROCEDURE:  04/02/2011 DATE OF DISCHARGE:                              OPERATIVE REPORT   PREOPERATIVE DIAGNOSES:  Right ankle and foot inversion injury with meniscoid lesion.  Tearing and tenosynovitis, peroneal tendons.  POSTOPERATIVE DIAGNOSES:  Right ankle and foot inversion injury with meniscoid lesion.  Tearing and tenosynovitis, peroneal tendons. Anterolateral and anteromedial meniscoid lesion.  Focal grade 4 osteochondral lesion, medial patellar dome.  Extensive tearing of the peroneus brevis tendon with the tenosynovitis of both the peroneus brevis and longus.  PROCEDURE:  Right ankle exam under anesthesia, arthroscopy.  Debridement of synovitis, meniscoid lesion.  Chondroplasty, microfracture of the medial talar dome.  Open exploration of peroneal tendons with tenosynovectomy of the peroneus brevis longus.  Treatment of extensive longitudinal tearing of the brevis with partial excision of the tendon, which was irreparable.  SURGEON:  Loreta Ave, M.D.  ASSISTANT:  Genene Churn. Barry Dienes, [place], present throughout the entire case, necessary for timely completion of procedure.  ANESTHESIA:  General.  BLOOD LOSS:  Minimal.  SPECIMENS:  None.  COMPLICATION:  None.  DRESSINGS:  Soft compressive with a well-padded short-leg splint.  TOURNIQUET TIME:  45 minutes.  PROCEDURE IN DETAIL:  The patient was brought to the operating room and placed on the operating table in supine position.  After adequate anesthesia had been obtained, ankle examined.  Full motion and stable ankle.  Tourniquet applied, prepped and draped in usual sterile fashion. Exsanguinated with elevation and Esmarch.  Tourniquet was inflated to 350 mmHg.  Two portals, one each medial, lateral, anterior  on the ankle avoiding neurovascular structures.  Arthroscope induced.  Ankle distended and inspected.  Fair amount of posttraumatic synovitis especially anterolaterally all debrided.  A focal 5-6 mm diameter full- thickness chondral lesion medial talar dome just up to the shoulder not over the shoulder.  Chondroplasty to a stable surface.  Micro-fracturing performed.  Fluid pressure reduced to confirm adequate bleeding.  Entire remaining ankle examined, no other findings appreciated.  Instruments were fully removed.  Incision along the peroneal tendons from the fibula distally and then extended proximally.  Skin and subcutaneous tissue divided.  Sural nerve protected throughout.  The tendon sheath over peroneal tendons open.  A lot of tenosynovitis in both tendon sheaths debrided.  Retinaculum on the fibula left intact to prevent subluxation. The longest was basically intact with a very superficial partial longitudinal tear nothing required to repair.  The brevis however had extensive tearing.  The inferior-third was markedly torn off and this was followed proximally above the fibula tapered out to just a thin band of the tendon.  The hole side of the tendon was completely resected out still leaving a generous amount of tendon intact from top to bottom more than half of the initial continuity.  Once I debrided out the markedly torn portion, I did not have to pull any further sutures in.  Both tendons looked at throughout their entire course.  Again, the retinaculum left intact.  Wound irrigated.  Closed with Vicryl and nylon.  Portals closed with nylon.  Sterile compressive dressing applied.  Tourniquet deflated and removed.  Anesthesia reversed. Brought to the recovery room.  Tolerated surgery well.  No complications.     Loreta Ave, M.D.     DFM/MEDQ  D:  04/02/2011  T:  04/03/2011  Job:  [id number]

## 2011-04-08 ENCOUNTER — Encounter (HOSPITAL_BASED_OUTPATIENT_CLINIC_OR_DEPARTMENT_OTHER): Payer: Self-pay

## 2011-04-22 ENCOUNTER — Other Ambulatory Visit: Payer: Self-pay | Admitting: Dermatology

## 2011-07-10 ENCOUNTER — Other Ambulatory Visit: Payer: Self-pay | Admitting: Gastroenterology

## 2013-04-25 ENCOUNTER — Ambulatory Visit
Admission: RE | Admit: 2013-04-25 | Discharge: 2013-04-25 | Disposition: A | Discharge status: Home / Self Care | Payer: PRIVATE HEALTH INSURANCE | Source: Ambulatory Visit | Attending: Family Medicine | Admitting: Family Medicine

## 2013-04-25 ENCOUNTER — Other Ambulatory Visit: Payer: Self-pay | Admitting: Family Medicine

## 2013-04-25 DIAGNOSIS — M542 Cervicalgia: Secondary | ICD-10-CM

## 2013-04-25 DIAGNOSIS — M25511 Pain in right shoulder: Secondary | ICD-10-CM

## 2016-08-03 MED FILL — LOSARTAN-HCTZ 50-12.5 MG TA: 50-12.5 | 30 days supply | Qty: 60 | Fill #0

## 2016-09-07 MED FILL — LOSARTAN-HCTZ 50-12.5 MG TA: 50-12.5 | 30 days supply | Qty: 60 | Fill #1

## 2016-09-16 ENCOUNTER — Other Ambulatory Visit: Payer: Self-pay | Admitting: Family Medicine

## 2016-09-16 DIAGNOSIS — R7989 Other specified abnormal findings of blood chemistry: Secondary | ICD-10-CM

## 2016-09-16 DIAGNOSIS — R945 Abnormal results of liver function studies: Principal | ICD-10-CM

## 2016-09-17 ENCOUNTER — Ambulatory Visit
Admission: RE | Admit: 2016-09-17 | Discharge: 2016-09-17 | Disposition: A | Discharge status: Home / Self Care | Payer: PRIVATE HEALTH INSURANCE | Source: Ambulatory Visit | Attending: Family Medicine | Admitting: Family Medicine

## 2016-09-17 DIAGNOSIS — R945 Abnormal results of liver function studies: Principal | ICD-10-CM

## 2016-09-17 DIAGNOSIS — R7989 Other specified abnormal findings of blood chemistry: Secondary | ICD-10-CM

## 2016-11-04 ENCOUNTER — Ambulatory Visit: Payer: PRIVATE HEALTH INSURANCE | Admitting: Podiatry

## 2017-05-10 ENCOUNTER — Encounter (INDEPENDENT_AMBULATORY_CARE_PROVIDER_SITE_OTHER): Payer: Self-pay | Admitting: Orthopaedic Surgery

## 2017-05-10 ENCOUNTER — Ambulatory Visit (INDEPENDENT_AMBULATORY_CARE_PROVIDER_SITE_OTHER): Payer: PRIVATE HEALTH INSURANCE | Admitting: Orthopaedic Surgery

## 2017-05-10 ENCOUNTER — Ambulatory Visit (INDEPENDENT_AMBULATORY_CARE_PROVIDER_SITE_OTHER): Payer: PRIVATE HEALTH INSURANCE

## 2017-05-10 DIAGNOSIS — G8929 Other chronic pain: Secondary | ICD-10-CM

## 2017-05-10 DIAGNOSIS — M5441 Lumbago with sciatica, right side: Secondary | ICD-10-CM | POA: Diagnosis not present

## 2017-05-10 MED ORDER — PREDNISONE 10 MG (21) PO TBPK: ORAL_TABLET | ORAL | 0 refills | Status: DC

## 2017-05-10 NOTE — Progress Notes (Signed)
Office Visit Note   Patient: [name] S Laduca           Date of Birth: 11/11/1960           MRN: [id number] Visit Date: 05/10/2017              Requested by: Lupita RaiderShaw, Kimberlee, MD [address] [place], [place] [address] PCP: Lupita RaiderShaw, Kimberlee, MD   Assessment & Plan: Visit Diagnoses:  1. Chronic right-sided low back pain with right-sided sciatica     Plan: Impression is lumbar spondylosis and low back pain.  Recommend prednisone taper and home exercise.  IM Toradol injection performed today.  If not better patient instructed to give us a call so that we can order MRI.  Follow-Up Instructions: Return if symptoms worsen or fail to improve.   Orders:  Orders Placed This Encounter  Procedures  . XR Lumbar Spine 2-3 Views  . Ambulatory referral to Physical Therapy   Meds ordered this encounter  Medications  . predniSONE (STERAPRED UNI-PAK 21 TAB) 10 MG (21) TBPK tablet    Sig: Take as directed    Dispense:  21 tablet    Refill:  0      Procedures: No procedures performed   Clinical Data: No additional findings.   Subjective: Chief Complaint  Patient presents with  . Lower Back - Pain    [name] is a 57 year old gentleman comes in with low back pain for over 10 years.  He denies any radicular symptoms down into his leg.  He mainly has some numbness into his back and buttock area.  He states that he denies any bowel bladder dysfunction.  He takes Tylenol for the pain.   Review of Systems  Constitutional: Negative.   All other systems reviewed and are negative.    Objective: Vital Signs: There were no vitals taken for this visit.  Physical Exam  Constitutional: He is oriented to person, place, and time. He appears well-developed and well-nourished.  HENT:  Head: Normocephalic and atraumatic.  Eyes: Pupils are equal, round, and reactive to light.  Neck: Neck supple.  Pulmonary/Chest: Effort normal.  Abdominal: Soft.  Musculoskeletal: Normal range of  motion.  Neurological: He is alert and oriented to person, place, and time.  Skin: Skin is warm.  Psychiatric: He has a normal mood and affect. His behavior is normal. Judgment and thought content normal.  Nursing note and vitals reviewed.   Ortho Exam Low back exam shows tenderness along the lumbar spine.  SI joints are nontender.  Trochanteric bursa is nontender.  Negative straight leg. Specialty Comments:  No specialty comments available.  Imaging: Xr Lumbar Spine 2-3 Views  Result Date: 05/10/2017 No significant degenerative disc disease.  Normal lumbar lordosis.  Presence of lumbar spondylosis.    PMFS History: There are no active problems to display for this patient.  Past Medical History:  Diagnosis Date  . Arthritis    HANDS KNEES  . High cholesterol   . Hypertension     History reviewed. No pertinent family history.  Past Surgical History:  Procedure Laterality Date  . ANKLE RECONSTRUCTION  04/02/2011   Procedure: RECONSTRUCTION ANKLE;  Surgeon: Loreta Aveaniel F Murphy, MD;  Location: [facility];  Service: Orthopedics;  Laterality: Right;  right ankle arthroscopy with debridement chondroplasty microfracture, resection of portion of peroneal tendon.  . APPENDECTOMY    . HAND SURGERY     Social History   Occupational History  . Not on file  Tobacco Use  . Smoking status: Former Research scientist (life sciences)  . Smokeless tobacco: Never Used  Substance and Sexual Activity  . Alcohol use: Yes  . Drug use: No  . Sexual activity: Not on file

## 2017-08-11 ENCOUNTER — Other Ambulatory Visit (HOSPITAL_COMMUNITY): Payer: Self-pay | Admitting: Gastroenterology

## 2017-08-11 DIAGNOSIS — R7989 Other specified abnormal findings of blood chemistry: Secondary | ICD-10-CM

## 2017-08-11 DIAGNOSIS — R945 Abnormal results of liver function studies: Secondary | ICD-10-CM

## 2017-09-24 ENCOUNTER — Other Ambulatory Visit: Payer: Self-pay | Admitting: Student

## 2017-09-24 ENCOUNTER — Other Ambulatory Visit: Payer: Self-pay | Admitting: Radiology

## 2017-09-27 ENCOUNTER — Ambulatory Visit (HOSPITAL_COMMUNITY)
Admission: RE | Admit: 2017-09-27 | Discharge: 2017-09-27 | Disposition: A | Discharge status: Home / Self Care | Payer: PRIVATE HEALTH INSURANCE | Source: Ambulatory Visit | Attending: Gastroenterology | Admitting: Gastroenterology

## 2017-09-27 ENCOUNTER — Encounter (HOSPITAL_COMMUNITY): Payer: Self-pay

## 2017-09-27 ENCOUNTER — Other Ambulatory Visit (HOSPITAL_COMMUNITY): Payer: Self-pay | Admitting: Gastroenterology

## 2017-09-27 DIAGNOSIS — R945 Abnormal results of liver function studies: Secondary | ICD-10-CM

## 2017-09-27 DIAGNOSIS — K7581 Nonalcoholic steatohepatitis (NASH): Secondary | ICD-10-CM | POA: Diagnosis not present

## 2017-09-27 DIAGNOSIS — R7989 Other specified abnormal findings of blood chemistry: Secondary | ICD-10-CM

## 2017-09-27 DIAGNOSIS — I10 Essential (primary) hypertension: Secondary | ICD-10-CM | POA: Diagnosis not present

## 2017-09-27 DIAGNOSIS — E7800 Pure hypercholesterolemia, unspecified: Secondary | ICD-10-CM | POA: Insufficient documentation

## 2017-09-27 DIAGNOSIS — M1990 Unspecified osteoarthritis, unspecified site: Secondary | ICD-10-CM | POA: Diagnosis not present

## 2017-09-27 DIAGNOSIS — Z79899 Other long term (current) drug therapy: Secondary | ICD-10-CM | POA: Insufficient documentation

## 2017-09-27 DIAGNOSIS — Z87891 Personal history of nicotine dependence: Secondary | ICD-10-CM | POA: Insufficient documentation

## 2017-09-27 DIAGNOSIS — M25511 Pain in right shoulder: Secondary | ICD-10-CM | POA: Diagnosis not present

## 2017-09-27 DIAGNOSIS — Z888 Allergy status to other drugs, medicaments and biological substances status: Secondary | ICD-10-CM | POA: Diagnosis not present

## 2017-09-27 DIAGNOSIS — Z9889 Other specified postprocedural states: Secondary | ICD-10-CM | POA: Diagnosis not present

## 2017-09-27 DIAGNOSIS — Z801 Family history of malignant neoplasm of trachea, bronchus and lung: Secondary | ICD-10-CM | POA: Insufficient documentation

## 2017-09-27 DIAGNOSIS — Z7952 Long term (current) use of systemic steroids: Secondary | ICD-10-CM | POA: Insufficient documentation

## 2017-09-27 HISTORY — PX: IR US GUIDE BX ASP/DRAIN: IMG2392

## 2017-09-27 LAB — CBC
HCT: 45.3 % (ref 39.0–52.0)
HEMOGLOBIN: 15.6 g/dL (ref 13.0–17.0)
MCH: 32.6 pg (ref 26.0–34.0)
MCHC: 34.4 g/dL (ref 30.0–36.0)
MCV: 94.6 fL (ref 78.0–100.0)
PLATELETS: 249 10*3/uL (ref 150–400)
RBC: 4.79 MIL/uL (ref 4.22–5.81)
RDW: 11.9 % (ref 11.5–15.5)
WBC: 6.5 10*3/uL (ref 4.0–10.5)

## 2017-09-27 LAB — PROTIME-INR
INR: 0.97
Prothrombin Time: 12.8 seconds (ref 11.4–15.2)

## 2017-09-27 MED ORDER — FENTANYL CITRATE (PF) 100 MCG/2ML IJ SOLN
INTRAMUSCULAR | Status: AC | PRN
  Administered 2017-09-27: 25 ug via INTRAVENOUS
  Administered 2017-09-27: 50 ug via INTRAVENOUS

## 2017-09-27 MED ORDER — HYDROCODONE-ACETAMINOPHEN 5-325 MG PO TABS
1.0000 | ORAL_TABLET | Freq: Once | ORAL | Status: AC
  Administered 2017-09-27: 1 via ORAL
  Filled 2017-09-27: qty 1

## 2017-09-27 MED ORDER — LIDOCAINE HCL 1 % IJ SOLN
INTRAMUSCULAR | Status: AC
  Filled 2017-09-27: qty 20

## 2017-09-27 MED ORDER — MIDAZOLAM HCL 2 MG/2ML IJ SOLN
INTRAMUSCULAR | Status: AC | PRN
  Administered 2017-09-27: 1 mg via INTRAVENOUS
  Administered 2017-09-27: 0.5 mg via INTRAVENOUS

## 2017-09-27 MED ORDER — FENTANYL CITRATE (PF) 100 MCG/2ML IJ SOLN
INTRAMUSCULAR | Status: AC
  Filled 2017-09-27: qty 4

## 2017-09-27 MED ORDER — SODIUM CHLORIDE 0.9 % IV SOLN: INTRAVENOUS | Status: DC

## 2017-09-27 MED ORDER — MIDAZOLAM HCL 2 MG/2ML IJ SOLN
INTRAMUSCULAR | Status: AC
  Filled 2017-09-27: qty 4

## 2017-09-27 MED ORDER — LIDOCAINE HCL (PF) 1 % IJ SOLN
INTRAMUSCULAR | Status: AC | PRN
  Administered 2017-09-27: 10 mL

## 2017-09-27 MED ORDER — GELATIN ABSORBABLE 12-7 MM EX MISC
CUTANEOUS | Status: AC
  Filled 2017-09-27: qty 1

## 2017-09-27 NOTE — Discharge Instructions (Signed)
Liver Biopsy, Care After °Refer to this sheet in the next few weeks. These instructions provide you with information on caring for yourself after your procedure. Your health care provider may also give you more specific instructions. Your treatment has been planned according to current medical practices, but problems sometimes occur. Call your health care provider if you have any problems or questions after your procedure. °What can I expect after the procedure? °After your procedure, it is typical to have the following: °· A small amount of discomfort in the area where the biopsy was done and in the right shoulder or shoulder blade. °· A small amount of bruising around the area where the biopsy was done and on the skin over the liver. °· Sleepiness and fatigue for the rest of the day. ° °Follow these instructions at home: °· Rest at home for 1-2 days or as directed by your health care provider. °· Have a friend or family member stay with you for at least 24 hours. °· Because of the medicines used during the procedure, you should not do the following things in the first 24 hours: °? Drive. °? Use machinery. °? Be responsible for the care of other people. °? Sign legal documents. °? Take a bath or shower. °· There are many different ways to close and cover an incision, including stitches, skin glue, and adhesive strips. Follow your health care provider's instructions on: °? Incision care. °? Bandage (dressing) changes and removal. °? Incision closure removal. °· Do not drink alcohol in the first week. °· Do not lift more than 5 pounds or play contact sports for 2 weeks after this test. °· Take medicines only as directed by your health care provider. Do not take medicine containing aspirin or non-steroidal anti-inflammatory medicines such as ibuprofen for 1 week after this test. °· It is your responsibility to get your test results. °Contact a health care provider if: °· You have increased bleeding from an incision  that results in more than a small spot of blood. °· You have redness, swelling, or increasing pain in any incisions. °· You notice a discharge or a bad smell coming from any of your incisions. °· You have a fever or chills. °Get help right away if: °· You develop swelling, bloating, or pain in your abdomen. °· You become dizzy or faint. °· You develop a rash. °· You are nauseous or vomit. °· You have difficulty breathing, feel short of breath, or feel faint. °· You develop chest pain. °· You have problems with your speech or vision. °· You have trouble balancing or moving your arms or legs. °This information is not intended to replace advice given to you by your health care provider. Make sure you discuss any questions you have with your health care provider. °Document Released: 08/08/2004 Document Revised: 06/27/2015 Document Reviewed: 03/17/2013 °Elsevier Interactive Patient Education © 2018 [employer] °Moderate Conscious Sedation, Adult, Care After °These instructions provide you with information about caring for yourself after your procedure. Your health care provider may also give you more specific instructions. Your treatment has been planned according to current medical practices, but problems sometimes occur. Call your health care provider if you have any problems or questions after your procedure. °What can I expect after the procedure? °After your procedure, it is common: °· To feel sleepy for several hours. °· To feel clumsy and have poor balance for several hours. °· To have poor judgment for several hours. °· To vomit if you eat   too soon. ° °Follow these instructions at home: °For at least 24 hours after the procedure: ° °· Do not: °? Participate in activities where you could fall or become injured. °? Drive. °? Use heavy machinery. °? Drink alcohol. °? Take sleeping pills or medicines that cause drowsiness. °? Make important decisions or sign legal documents. °? Take care of children on your  own. °· Rest. °Eating and drinking °· Follow the diet recommended by your health care provider. °· If you vomit: °? Drink water, juice, or soup when you can drink without vomiting. °? Make sure you have little or no nausea before eating solid foods. °General instructions °· Have a responsible adult stay with you until you are awake and alert. °· Take over-the-counter and prescription medicines only as told by your health care provider. °· If you smoke, do not smoke without supervision. °· Keep all follow-up visits as told by your health care provider. This is important. °Contact a health care provider if: °· You keep feeling nauseous or you keep vomiting. °· You feel light-headed. °· You develop a rash. °· You have a fever. °Get help right away if: °· You have trouble breathing. °This information is not intended to replace advice given to you by your health care provider. Make sure you discuss any questions you have with your health care provider. °Document Released: 11/09/2012 Document Revised: 06/24/2015 Document Reviewed: 05/11/2015 °Elsevier Interactive Patient Education © 2018 [employer] ° °

## 2017-09-27 NOTE — Progress Notes (Signed)
Dr Loreta AveWagner notified of client c/o right shoulder pain and PA will be in to assess

## 2017-09-27 NOTE — Discharge Instructions (Signed)
Liver Biopsy, Care After °These instructions give you information on caring for yourself after your procedure. Your doctor may also give you more specific instructions. Call your doctor if you have any problems or questions after your procedure. °Follow these instructions at home: °· Rest at home for 1-2 days or as told by your doctor. °· Have someone stay with you for at least 24 hours. °· Do not do these things in the first 24 hours: °? Drive. °? Use machinery. °? Take care of other people. °? Sign legal documents. °? Take a bath or shower. °· There are many different ways to close and cover a cut (incision). For example, a cut can be closed with stitches, skin glue, or adhesive strips. Follow your doctor's instructions on: °? Taking care of your cut. °? Changing and removing your bandage (dressing). °? Removing whatever was used to close your cut. °· Do not drink alcohol in the first week. °· Do not lift more than 5 pounds or play contact sports for the first 2 weeks. °· Take medicines only as told by your doctor. For 1 week, do not take medicine that has aspirin in it or medicines like ibuprofen. °· Get your test results. °Contact a doctor if: °· A cut bleeds and leaves more than just a small spot of blood. °· A cut is red, puffs up (swells), or hurts more than before. °· Fluid or something else comes from a cut. °· A cut smells bad. °· You have a fever or chills. °Get help right away if: °· You have swelling, bloating, or pain in your belly (abdomen). °· You get dizzy or faint. °· You have a rash. °· You feel sick to your stomach (nauseous) or throw up (vomit). °· You have trouble breathing, feel short of breath, or feel faint. °· Your chest hurts. °· You have problems talking or seeing. °· You have trouble balancing or moving your arms or legs. °This information is not intended to replace advice given to you by your health care provider. Make sure you discuss any questions you have with your health care  provider. °Document Released: 10/29/2007 Document Revised: 06/27/2015 Document Reviewed: 03/17/2013 °Elsevier Interactive Patient Education © 2018 Elsevier [employer] ° ° ° ° °Moderate Conscious Sedation, Adult, Care After °These instructions provide you with information about caring for yourself after your procedure. Your health care provider may also give you more specific instructions. Your treatment has been planned according to current medical practices, but problems sometimes occur. Call your health care provider if you have any problems or questions after your procedure. °What can I expect after the procedure? °After your procedure, it is common: °· To feel sleepy for several hours. °· To feel clumsy and have poor balance for several hours. °· To have poor judgment for several hours. °· To vomit if you eat too soon. ° °Follow these instructions at home: °For at least 24 hours after the procedure: ° °· Do not: °? Participate in activities where you could fall or become injured. °? Drive. °? Use heavy machinery. °? Drink alcohol. °? Take sleeping pills or medicines that cause drowsiness. °? Make important decisions or sign legal documents. °? Take care of children on your own. °· Rest. °Eating and drinking °· Follow the diet recommended by your health care provider. °· If you vomit: °? Drink water, juice, or soup when you can drink without vomiting. °? Make sure you have little or no nausea before eating solid foods. °General instructions °·   Have a responsible adult stay with you until you are awake and alert. °· Take over-the-counter and prescription medicines only as told by your health care provider. °· If you smoke, do not smoke without supervision. °· Keep all follow-up visits as told by your health care provider. This is important. °Contact a health care provider if: °· You keep feeling nauseous or you keep vomiting. °· You feel light-headed. °· You develop a rash. °· You have a fever. °Get help right away  if: °· You have trouble breathing. °This information is not intended to replace advice given to you by your health care provider. Make sure you discuss any questions you have with your health care provider. °Document Released: 11/09/2012 Document Revised: 06/24/2015 Document Reviewed: 05/11/2015 °Elsevier Interactive Patient Education © 2018 Elsevier Inc. ° ° °

## 2017-09-27 NOTE — H&P (Signed)
Chief Complaint: Patient was seen in consultation today for elevated liver enzymes  Referring Physician(s): Willis Modenautlaw,William  Supervising Physician: Gilmer MorWagner, Jaime  Patient Status: [facility] - Out[facility]  History of Present Illness: [name] is a 57 y.o. male with past medical history of high cholesterol, HTN, and arthritis who presents to radiology department today for random liver biopsy due to elevated liver enzymes. He was recently on Humira for arthritis.  Patient presents in his usual state of health for procedure today. Denies fever, chills, nausea, vomiting, abdominal pain, dysuria.   Patient has been NPO.  He does not take blood thinners.   Past Medical History:  Diagnosis Date  . Arthritis    HANDS KNEES  . High cholesterol   . Hypertension     Past Surgical History:  Procedure Laterality Date  . ANKLE RECONSTRUCTION  04/02/2011   Procedure: RECONSTRUCTION ANKLE;  Surgeon: Loreta Aveaniel F Murphy, MD;  Location: [facility];  Service: Orthopedics;  Laterality: Right;  right ankle arthroscopy with debridement chondroplasty microfracture, resection of portion of peroneal tendon.  . APPENDECTOMY    . HAND SURGERY      Allergies: Lisinopril  Medications: Prior to Admission medications   Medication Sig Start Date End Date Taking? Authorizing Provider  Ascorbic Acid (VITAMIN C) 100 MG tablet Take 100 mg by mouth daily.   Yes [provider]  cholecalciferol (VITAMIN D) 1000 units tablet Take 1,000 Units by mouth daily.   Yes [provider]  Olmesartan-amLODIPine-HCTZ 40-10-12.5 MG TABS Take by mouth.   Yes [provider]  Omega-3 Fatty Acids (FISH OIL) 1000 MG CAPS Take by mouth.   Yes [provider]  Multiple Vitamin (MULITIVITAMIN WITH MINERALS) TABS Take 1 tablet by mouth daily.    [provider]  predniSONE (STERAPRED UNI-PAK 21 TAB) 10 MG (21) TBPK tablet Take as directed 05/10/17   Tarry KosXu, Naiping M, MD     Family  History  Problem Relation Age of Onset  . Lung cancer Father     Social History   Socioeconomic History  . Marital status: Single    Spouse name: Not on file  . Number of children: Not on file  . Years of education: Not on file  . Highest education level: Not on file  Occupational History  . Not on file  Social Needs  . Financial resource strain: Not on file  . Food insecurity:    Worry: Not on file    Inability: Not on file  . Transportation needs:    Medical: Not on file    Non-medical: Not on file  Tobacco Use  . Smoking status: Former Games developermoker  . Smokeless tobacco: Never Used  Substance and Sexual Activity  . Alcohol use: Yes    Comment: occassional  . Drug use: No  . Sexual activity: Not on file  Lifestyle  . Physical activity:    Days per week: Not on file    Minutes per session: Not on file  . Stress: Not on file  Relationships  . Social connections:    Talks on phone: Not on file    Gets together: Not on file    Attends religious service: Not on file    Active member of club or organization: Not on file    Attends meetings of clubs or organizations: Not on file    Relationship status: Not on file  Other Topics Concern  . Not on file  Social History Narrative  . Not  on file    Review of Systems: A 12 point ROS discussed and pertinent positives are indicated in the HPI above.  All other systems are negative.  Review of Systems  Constitutional: Negative for fatigue and fever.  Respiratory: Negative for cough and shortness of breath.   Cardiovascular: Negative for chest pain.  Gastrointestinal: Negative for abdominal pain, nausea and vomiting.  Musculoskeletal: Negative for back pain.  Psychiatric/Behavioral: Negative for behavioral problems and confusion.    Vital Signs: BP (!) 152/100   Pulse 81   Temp 97.7 F (36.5 C) (Oral)   Resp 16   Ht 5\' 11"  (1.803 m)   Wt 240 lb (108.9 kg)   SpO2 96%   BMI 33.47 kg/m   Physical Exam  Constitutional:  He is oriented to person, place, and time. He appears well-developed. No distress.  Cardiovascular: Normal rate, regular rhythm and normal heart sounds. Exam reveals no gallop and no friction rub.  No murmur heard. Pulmonary/Chest: Effort normal and breath sounds normal. No respiratory distress.  Abdominal: Soft. He exhibits no distension. There is no tenderness.  Neurological: He is alert and oriented to person, place, and time.  Skin: Skin is warm and dry. He is not diaphoretic.  Psychiatric: He has a normal mood and affect. His behavior is normal. Judgment and thought content normal.  Nursing note and vitals reviewed.    MD Evaluation Airway: WNL Heart: WNL Abdomen: WNL Chest/ Lungs: WNL ASA  Classification: 3 Mallampati/Airway Score: One   Imaging: No results found.  Labs:  CBC: Recent Labs    09/27/17 0615  WBC 6.5  HGB 15.6  HCT 45.3  PLT 249    COAGS: Recent Labs    09/27/17 0615  INR 0.97    BMP: No results for input(s): NA, K, CL, CO2, GLUCOSE, BUN, CALCIUM, CREATININE, GFRNONAA, GFRAA in the last 8760 hours.  Invalid input(s): CMP  LIVER FUNCTION TESTS: No results for input(s): BILITOT, AST, ALT, ALKPHOS, PROT, ALBUMIN in the last 8760 hours.  TUMOR MARKERS: No results for input(s): AFPTM, CEA, CA199, CHROMGRNA in the last 8760 hours.  Assessment and Plan: Patient with past medical history of arthritis presents with complaint of elevated liver enzymes.  IR consulted for random liver biopsy at the request of Dr. Dulce Sellar. Case reviewed by Dr. Loreta Ave who approves patient for procedure.  Patient presents today in their usual state of health.  He has been NPO and is not currently on blood thinners.   Risks and benefits discussed with the patient including, but not limited to bleeding, infection, damage to adjacent structures or low yield requiring additional tests.  All of the patient's questions were answered, patient is agreeable to  proceed. Consent signed and in chart.  Thank you for this interesting consult.  I greatly enjoyed meeting RYOSUKE ERICKSEN and look forward to participating in their care.  A copy of this report was sent to the requesting provider on this date.  Electronically Signed: Hoyt Koch, PA 09/27/2017, 7:38 AM   I spent a total of  30 Minutes   in face to face in clinical consultation, greater than 50% of which was counseling/coordinating care for elevated liver enzymes.

## 2017-09-27 NOTE — Progress Notes (Signed)
PA paged to bedside for patient with 8/10 pain in his right shoulder post-procedure.  Abdomen soft, non-distended, non-tender.  States constant pain in his right shoulder unchanged with movement or palpation.  Patient given Norco 5/325mg .  Loyce DysKacie Matthews, MS RD PA-C 10:08 AM

## 2017-09-27 NOTE — Procedures (Signed)
Interventional Radiology Procedure Note  Procedure: US guided biopsy of liver, medical liver.  Complications: None Recommendations:  - Ok to shower tomorrow - Do not submerge for 7 days - Routine care  - DC home 2 hours - advance diet  Signed,  Yvone NeuJaime S. Loreta AveWagner, DO

## 2018-02-27 IMAGING — US US ABDOMEN LIMITED
1 series · 14 of 25 positions shown · non-contrast
Comparison: CT scan 04/22/2006

CLINICAL DATA: Elevated liver function studies.

EXAM:
ULTRASOUND ABDOMEN LIMITED RIGHT UPPER QUADRANT

[Series 1: us abdomen limited · 0.31mm/px · 14 of 38 slices shown]
[im 1/38]
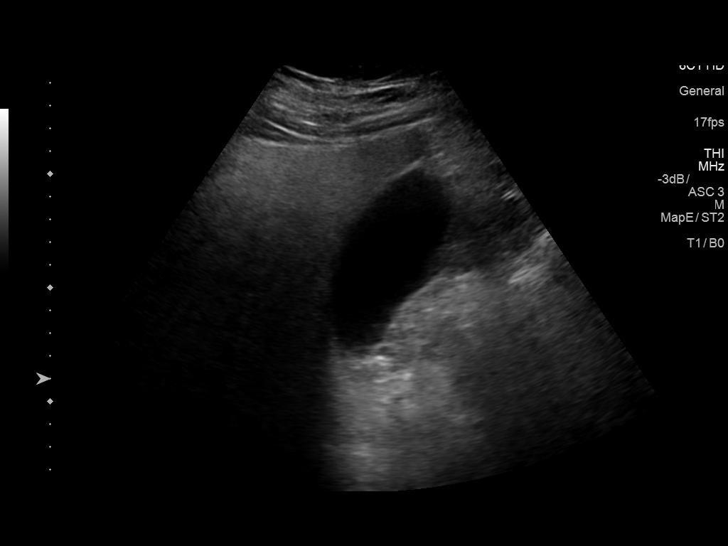
[im 4/38]
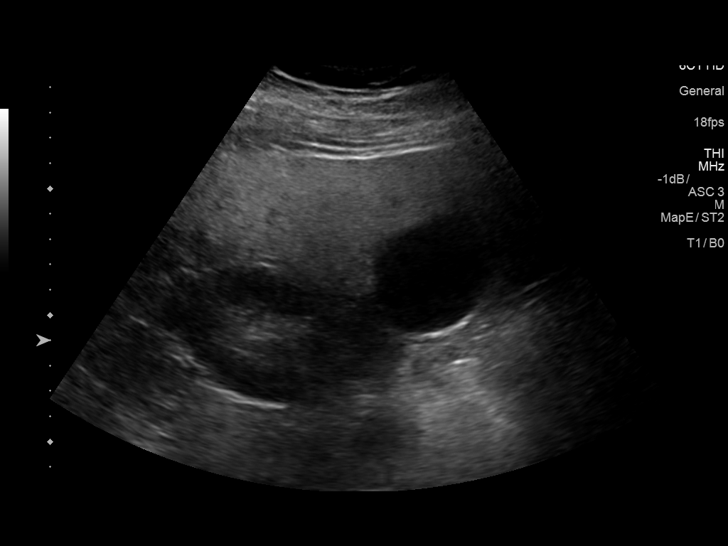
[im 7/38]
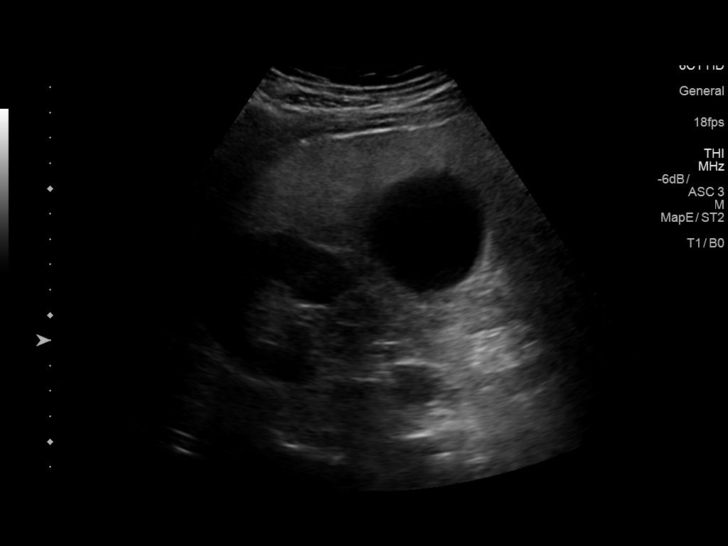
[im 10/38]
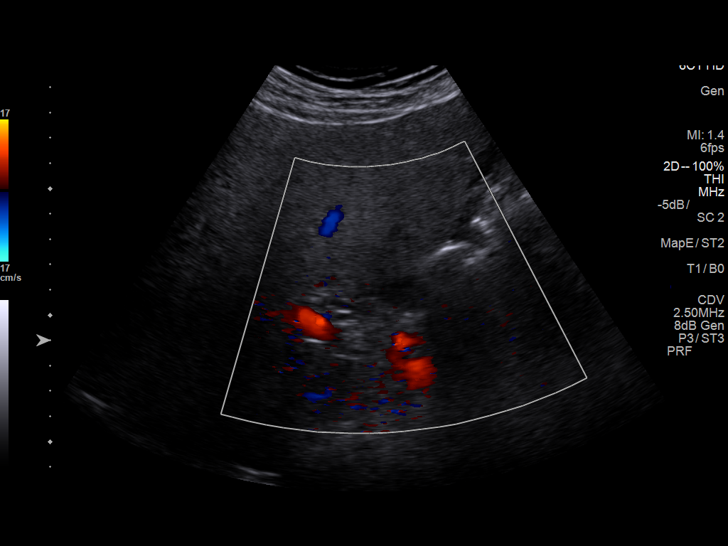
[im 13/38]
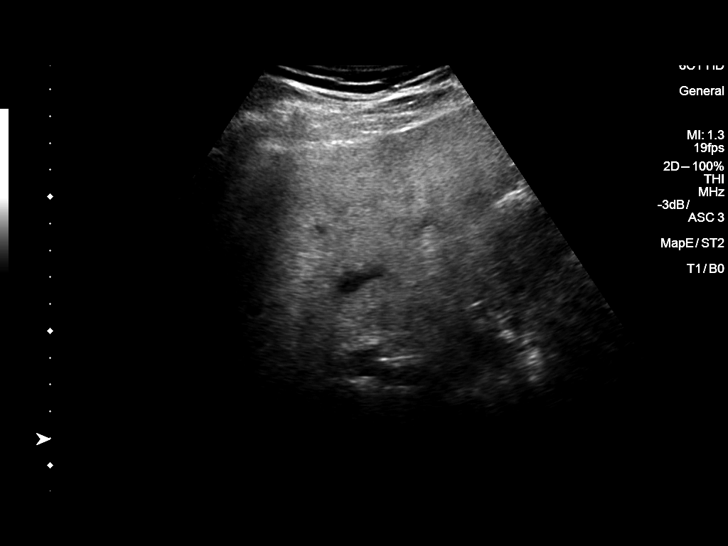
[im 14/38]
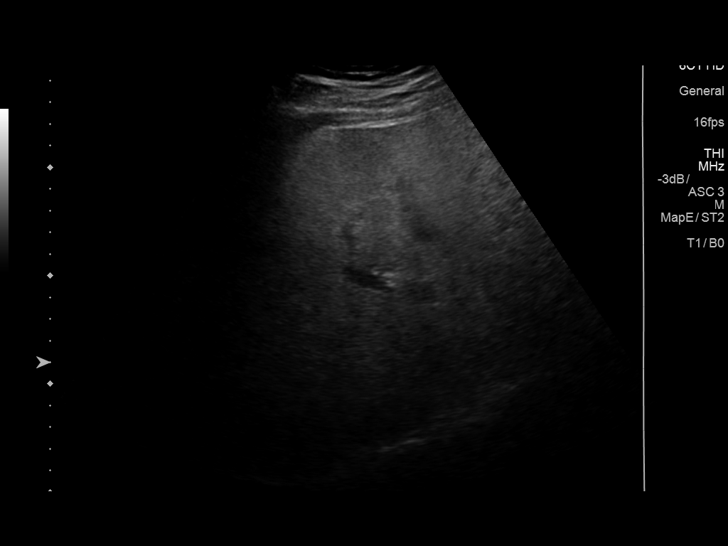
[im 17/38]
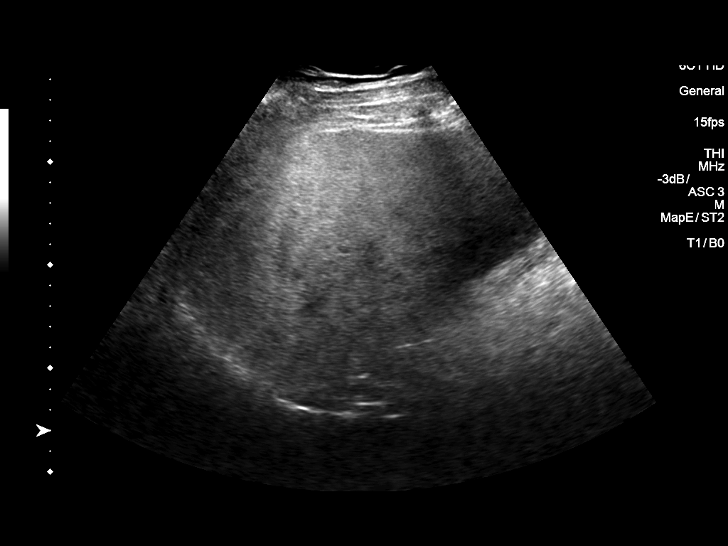
[im 21/38]
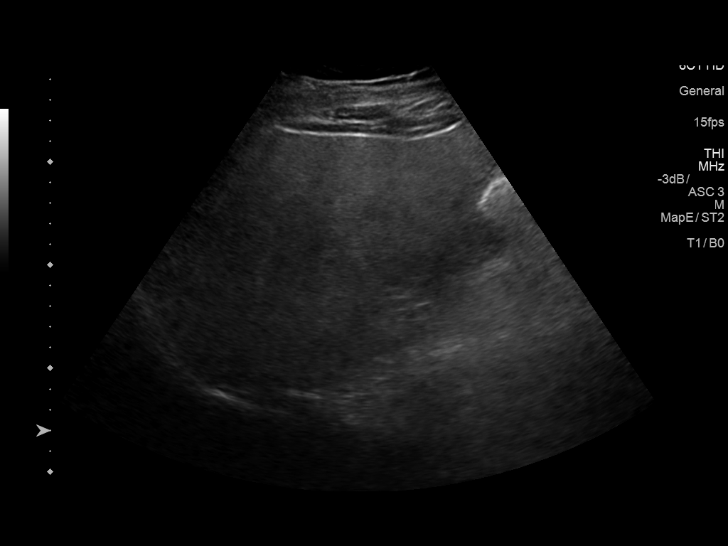
[im 24/38]
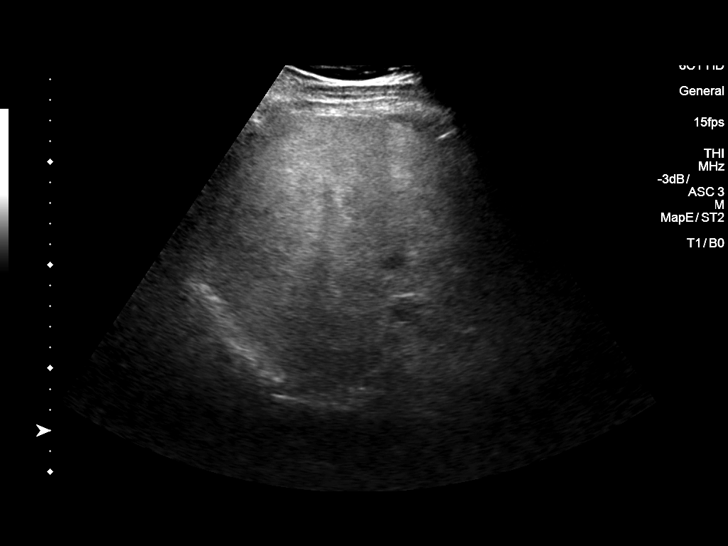
[im 25/38]
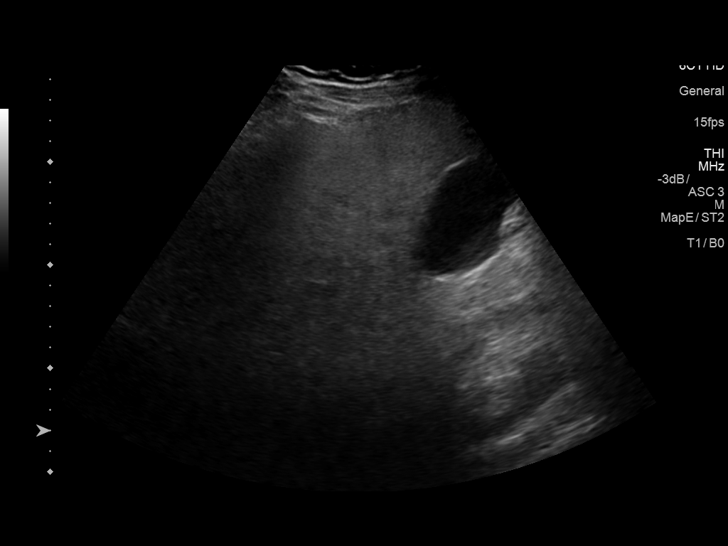
[im 28/38]
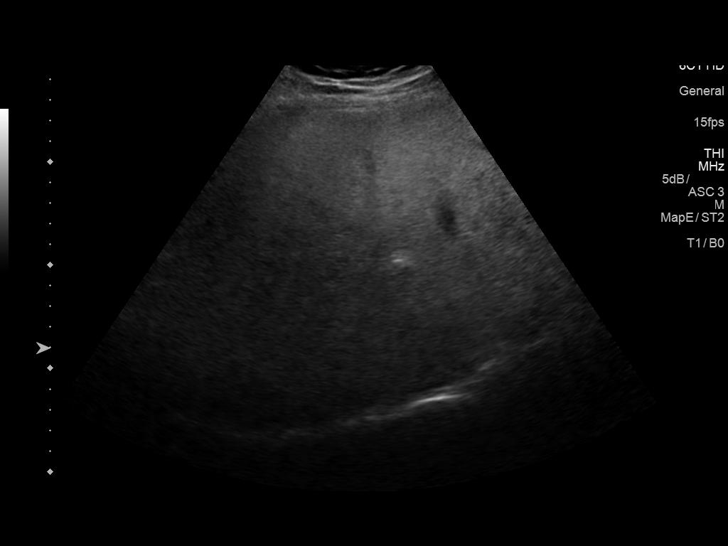
[im 31/38]
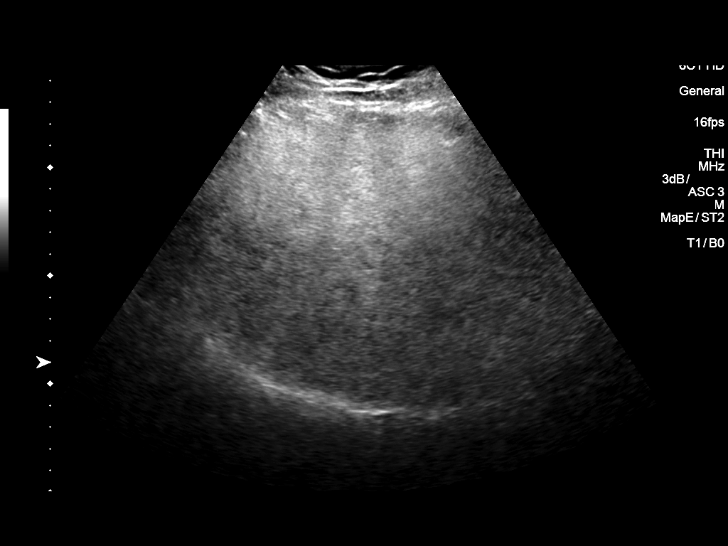
[im 34/38]
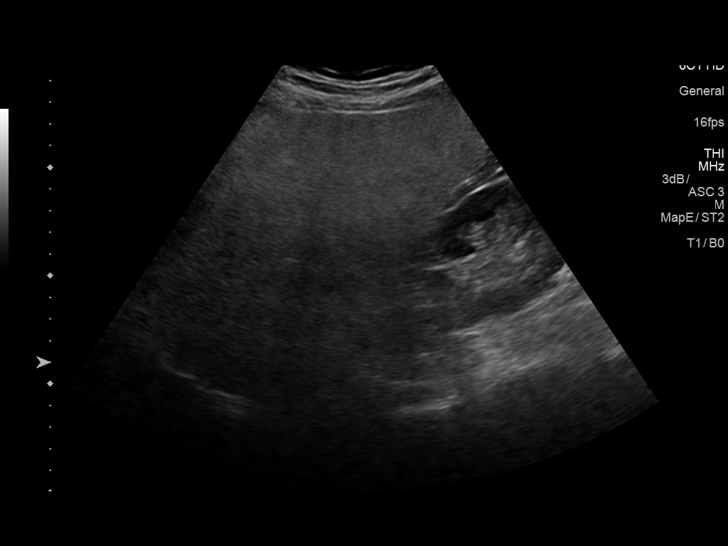
[im 38/38]
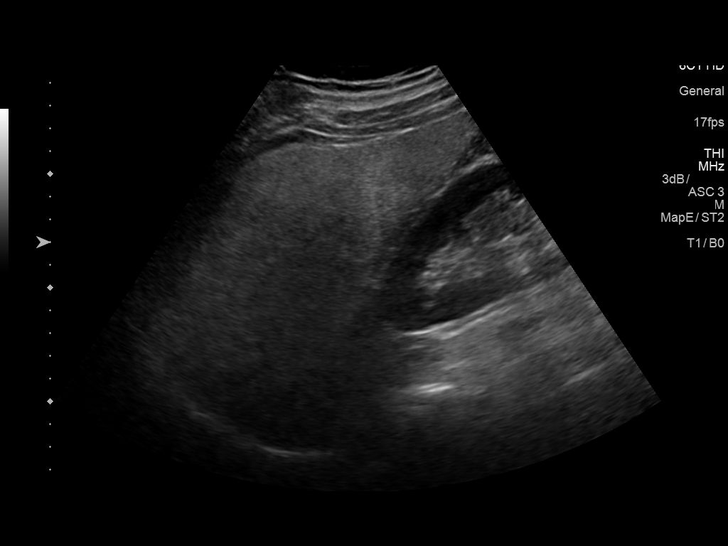

[14 of 25 positions shown; findings below may reference images not displayed]

FINDINGS: Gallbladder:

No gallstones or wall thickening visualized. No sonographic Murphy
sign noted by sonographer.

Common bile duct:

Diameter: 3.4 mm

Liver:

There is diffuse increased echogenicity of the liver and somewhat
coarse echotexture with poor definition of the liver architecture
and poor through transmission. Findings suggest diffuse fatty
infiltration. No focal hepatic lesions or intrahepatic biliary
dilatation. There appears to be normal directional flow within the
portal vein.
IMPRESSION: Normal gallbladder and normal caliber common bile duct.

Diffuse fatty infiltration of the liver but no focal hepatic lesions
or intrahepatic biliary dilatation.

## 2018-12-06 ENCOUNTER — Other Ambulatory Visit: Payer: Self-pay

## 2018-12-06 DIAGNOSIS — Z20822 Contact with and (suspected) exposure to covid-19: Secondary | ICD-10-CM

## 2018-12-07 LAB — NOVEL CORONAVIRUS, NAA: SARS-CoV-2, NAA: NOT DETECTED

## 2019-02-15 ENCOUNTER — Other Ambulatory Visit: Payer: Self-pay | Admitting: Nurse Practitioner

## 2019-02-15 DIAGNOSIS — K7581 Nonalcoholic steatohepatitis (NASH): Secondary | ICD-10-CM

## 2019-02-24 ENCOUNTER — Other Ambulatory Visit: Payer: PRIVATE HEALTH INSURANCE

## 2019-03-03 ENCOUNTER — Ambulatory Visit
Admission: RE | Admit: 2019-03-03 | Discharge: 2019-03-03 | Disposition: A | Discharge status: Home / Self Care | Payer: PRIVATE HEALTH INSURANCE | Source: Ambulatory Visit | Attending: Nurse Practitioner | Admitting: Nurse Practitioner

## 2019-03-03 DIAGNOSIS — K7581 Nonalcoholic steatohepatitis (NASH): Secondary | ICD-10-CM

## 2019-06-05 IMAGING — US IR US GUIDANCE
1 series · 3 of 3 positions shown · non-contrast
Comparison: none

INDICATION: 57-year-old male with a history of transaminitis

[Series 1: ir us guidance · 3 of 3 slices shown]
[im 1/3]
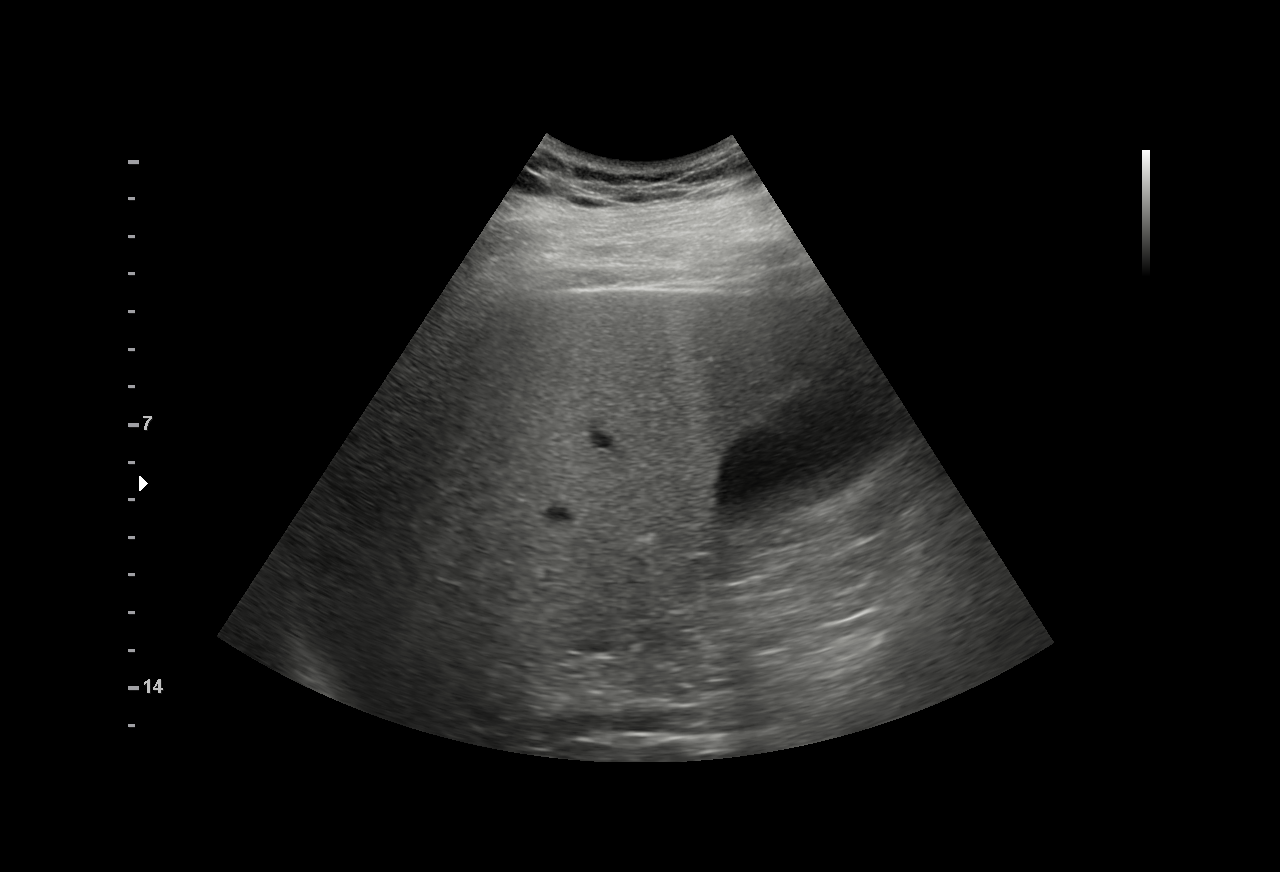
[im 2/3]
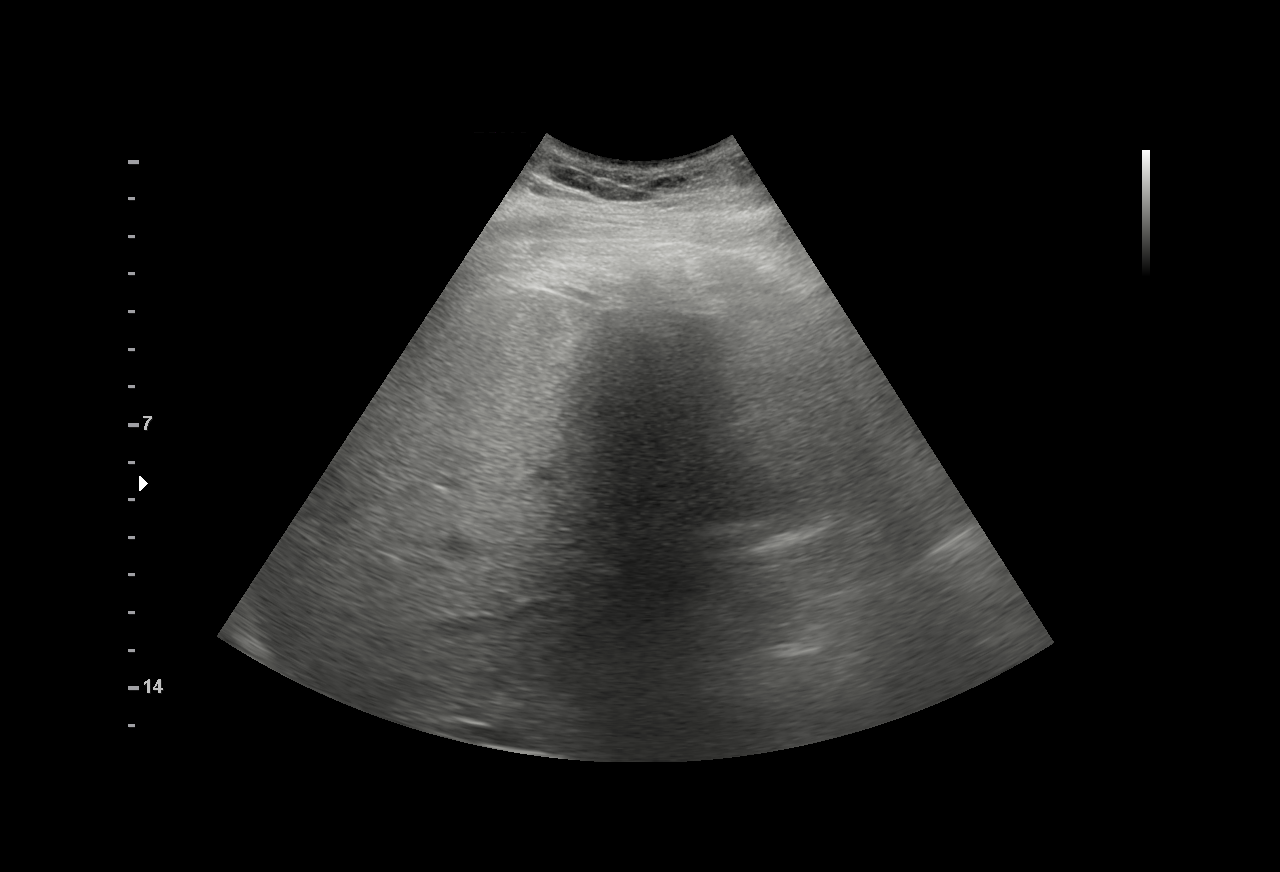
[im 3/3]
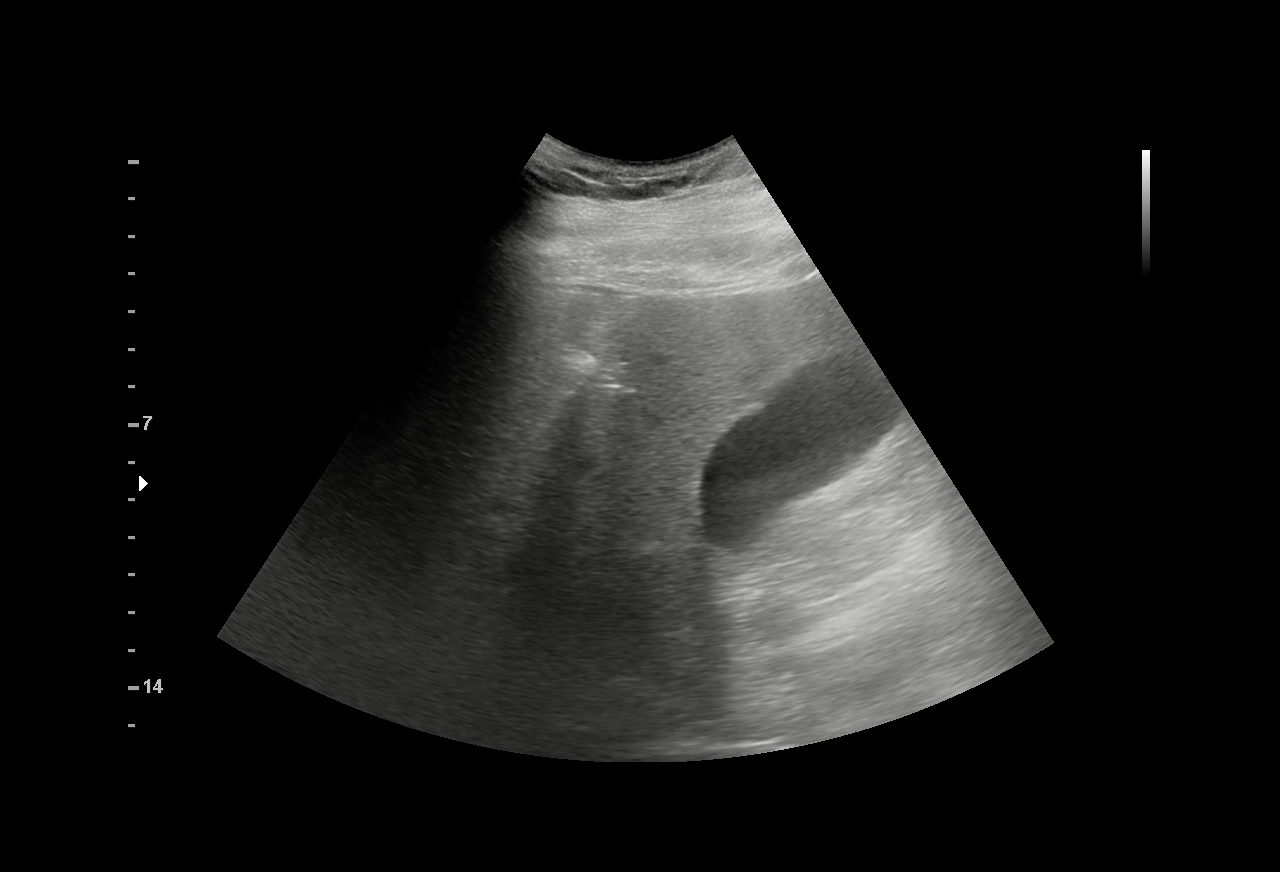

[3 of 3 positions shown; findings below may reference images not displayed]

EXAM:
IR ULTRASOUND GUIDANCE

MEDICATIONS:
None.

ANESTHESIA/SEDATION:
Moderate (conscious) sedation was employed during this procedure. A
total of Versed 1.5 mg and Fentanyl 75 mcg was administered
intravenously.

Moderate Sedation Time: 11 minutes. The patient's level of
consciousness and vital signs were monitored continuously by
radiology nursing throughout the procedure under my direct
supervision.

FLUOROSCOPY TIME:  None

COMPLICATIONS:
None

PROCEDURE:
The procedure, risks, benefits, and alternatives were explained to
the patient. Questions regarding the procedure were encouraged and
answered. The patient understands and consents to the procedure.

Ultrasound survey of the right liver lobe performed with images
stored and sent to PACs.

The right lower thorax/right upper abdomen was prepped with
chlorhexidine in a sterile fashion, and a sterile drape was applied
covering the operative field. A sterile gown and sterile gloves were
used for the procedure. Local anesthesia was provided with 1%
Lidocaine.

Once the patient is prepped and draped sterilely and the skin and
subcutaneous tissues were generously infiltrated with 1% lidocaine,
a small stab incision was made with an 11 blade scalpel.

A 17 gauge introducer needle was advanced under ultrasound guidance
in an intercostal location into the right liver lobe. The stylet was
removed, and 3 separate 18 gauge core biopsy were retrieved. Samples
were placed into formalin for transportation to the lab.

Three separate Gel-Foam pledgets were then infused with a small
amount of saline for assistance with hemostasis.

The needle was removed, and a final ultrasound image was performed.

The patient tolerated the procedure well and remained
hemodynamically stable throughout.

No complications were encountered and no significant blood loss was
encounter.
IMPRESSION: Status post ultrasound-guided medical liver biopsy. Tissue specimen
sent to pathology for complete histopathologic analysis.

## 2020-01-14 ENCOUNTER — Other Ambulatory Visit: Payer: Self-pay

## 2020-01-14 ENCOUNTER — Ambulatory Visit (INDEPENDENT_AMBULATORY_CARE_PROVIDER_SITE_OTHER): Payer: PRIVATE HEALTH INSURANCE

## 2020-01-14 ENCOUNTER — Ambulatory Visit
Admission: EM | Admit: 2020-01-14 | Discharge: 2020-01-14 | Disposition: A | Discharge status: Home / Self Care | Payer: PRIVATE HEALTH INSURANCE | Attending: Emergency Medicine | Admitting: Emergency Medicine

## 2020-01-14 DIAGNOSIS — J22 Unspecified acute lower respiratory infection: Secondary | ICD-10-CM | POA: Diagnosis not present

## 2020-01-14 DIAGNOSIS — R059 Cough, unspecified: Secondary | ICD-10-CM | POA: Diagnosis not present

## 2020-01-14 MED ORDER — PREDNISONE 10 MG PO TABS: ORAL_TABLET | ORAL | 0 refills | Status: DC

## 2020-01-14 MED ORDER — DOXYCYCLINE HYCLATE 100 MG PO CAPS: 100.0000 mg | ORAL_CAPSULE | Freq: Two times a day (BID) | ORAL | 0 refills | Status: AC

## 2020-01-14 MED ORDER — BENZONATATE 200 MG PO CAPS: 200.0000 mg | ORAL_CAPSULE | Freq: Three times a day (TID) | ORAL | 0 refills | Status: AC | PRN

## 2020-01-14 MED ORDER — HYDROCODONE-HOMATROPINE 5-1.5 MG/5ML PO SYRP: 5.0000 mL | ORAL_SOLUTION | Freq: Every evening | ORAL | 0 refills | Status: DC | PRN

## 2020-01-14 NOTE — ED Triage Notes (Signed)
Patient presents to Urgent Care with complaints of cough and congestion x 2 weeks. Patient reports being tested for COVID a few weeks ago and results were negative. Here to rule out covid.    Denies fever, N/V, or diarrhea.

## 2020-01-14 NOTE — ED Notes (Signed)
Provider was notified of pts. BP reading.

## 2020-01-14 NOTE — ED Provider Notes (Signed)
[facility]    CSN: [id number] Arrival date & time: 01/14/20  1036      History   Chief Complaint Chief Complaint  Patient presents with  . Cough  . Nasal Congestion    HPI [name] is a 59 y.o. male history of hypertension, presenting today for evaluation of cough and congestion.  Reports URI symptoms and cough for approximately 2 to 3 weeks.  Initially with fevers, but these have resolved.  Reports continued congestion and drainage that is thick and discolored out of sinuses as well as chest.  Using over-the-counter medicines without relief.  HPI  Past Medical History:  Diagnosis Date  . Arthritis    HANDS KNEES  . High cholesterol   . Hypertension     There are no problems to display for this patient.   Past Surgical History:  Procedure Laterality Date  . ANKLE RECONSTRUCTION  04/02/2011   Procedure: RECONSTRUCTION ANKLE;  Surgeon: Loreta Ave, MD;  Location: [facility];  Service: Orthopedics;  Laterality: Right;  right ankle arthroscopy with debridement chondroplasty microfracture, resection of portion of peroneal tendon.  . APPENDECTOMY    . HAND SURGERY    . IR US GUIDE BX ASP/DRAIN  09/27/2017       Home Medications    Prior to Admission medications   Medication Sig Start Date End Date Taking? Authorizing Provider  Ascorbic Acid (VITAMIN C) 100 MG tablet Take 100 mg by mouth daily.    [provider]  benzonatate (TESSALON) 200 MG capsule Take 1 capsule (200 mg total) by mouth 3 (three) times daily as needed for up to 7 days for cough. 01/14/20 01/21/20  Daylan Juhnke C, PA-C  doxycycline (VIBRAMYCIN) 100 MG capsule Take 1 capsule (100 mg total) by mouth 2 (two) times daily for 10 days. 01/14/20 01/24/20  Thaddeaus Monica C, PA-C  HYDROcodone-homatropine (HYCODAN) 5-1.5 MG/5ML syrup Take 5 mLs by mouth at bedtime as needed for cough. 01/14/20   Shelsea Hangartner C, PA-C  Multiple Vitamin (MULITIVITAMIN WITH  MINERALS) TABS Take 1 tablet by mouth daily.    [provider]  Olmesartan-amLODIPine-HCTZ 40-10-12.5 MG TABS Take by mouth.    [provider]  Omega-3 Fatty Acids (FISH OIL) 1000 MG CAPS Take by mouth.    [provider]  predniSONE (DELTASONE) 10 MG tablet Begin with 6 tabs on day 1, 5 tab on day 2, 4 tab on day 3, 3 tab on day 4, 2 tab on day 5, 1 tab on day 6-take with food 01/14/20   Muna Demers, [place] C, PA-C    Family History Family History  Problem Relation Age of Onset  . Lung cancer Father     Social History Social History   Tobacco Use  . Smoking status: Former Games developer  . Smokeless tobacco: Never Used  Vaping Use  . Vaping Use: Never used  Substance Use Topics  . Alcohol use: Yes    Comment: occassional  . Drug use: No     Allergies   Lisinopril   Review of Systems Review of Systems  Constitutional: Negative for activity change, appetite change, chills, fatigue and fever.  HENT: Positive for congestion and rhinorrhea. Negative for ear pain, sinus pressure, sore throat and trouble swallowing.   Eyes: Negative for discharge and redness.  Respiratory: Positive for cough. Negative for chest tightness and shortness of breath.   Cardiovascular: Negative for chest pain.  Gastrointestinal: Negative for abdominal pain, diarrhea, nausea and vomiting.  Musculoskeletal: Negative for myalgias.  Skin: Negative for rash.  Neurological: Negative for dizziness, light-headedness and headaches.     Physical Exam Triage Vital Signs ED Triage Vitals  Enc Vitals Group     BP      Pulse      Resp      Temp      Temp src      SpO2      Weight      Height      Head Circumference      Peak Flow      Pain Score      Pain Loc      Pain Edu?      Excl. in GC?    No data found.  Updated Vital Signs BP (S) (!) 162/94   Pulse 85   Temp 98 F (36.7 C) (Oral)   Resp 16   SpO2 96%   Visual Acuity Right Eye Distance:   Left Eye Distance:    Bilateral Distance:    Right Eye Near:   Left Eye Near:    Bilateral Near:     Physical Exam Vitals and nursing note reviewed.  Constitutional:      Appearance: He is well-developed and well-nourished.     Comments: No acute distress  HENT:     Head: Normocephalic and atraumatic.     Ears:     Comments: Bilateral ears without tenderness to palpation of external auricle, tragus and mastoid, EAC's without erythema or swelling, TM's with good bony landmarks and cone of light. Non erythematous.     Nose: Nose normal.     Mouth/Throat:     Comments: Oral mucosa pink and moist, no tonsillar enlargement or exudate. Posterior pharynx patent and nonerythematous, no uvula deviation or swelling. Normal phonation. Eyes:     Conjunctiva/sclera: Conjunctivae normal.  Cardiovascular:     Rate and Rhythm: Normal rate and regular rhythm.  Pulmonary:     Effort: Pulmonary effort is normal. No respiratory distress.     Comments: Breathing comfortably at rest, CTABL, no wheezing, rales or other adventitious sounds auscultated Abdominal:     General: There is no distension.  Musculoskeletal:        General: Normal range of motion.     Cervical back: Neck supple.  Skin:    General: Skin is warm and dry.  Neurological:     Mental Status: He is alert and oriented to person, place, and time.  Psychiatric:        Mood and Affect: Mood and affect normal.      UC Treatments / Results  Labs (all labs ordered are listed, but only abnormal results are displayed) Labs Reviewed  NOVEL CORONAVIRUS, NAA    EKG   Radiology DG Chest 2 View  Result Date: 01/14/2020 CLINICAL DATA:  Productive cough. EXAM: CHEST - 2 VIEW COMPARISON:  Jun 04, 2006. FINDINGS: The heart size and mediastinal contours are within normal limits. Both lungs are clear. The visualized skeletal structures are unremarkable. IMPRESSION: No active cardiopulmonary disease. Electronically Signed   By: Lupita Raider M.D.   On:  01/14/2020 12:52    Procedures Procedures (including critical care time)  Medications Ordered in UC Medications - No data to display  Initial Impression / Assessment and Plan / UC Course  I have reviewed the triage vital signs and the nursing notes.  Pertinent labs & imaging results that were available during my care of the patient were reviewed  by me and considered in my medical decision making (see chart for details).     X-ray not suggestive of pneumonia, treating for bronchitis/sinusitis with doxycycline, prednisone, continue symptomatic and supportive care of cough and congestion as well.  Covid test pending.  Discussed strict return precautions. Patient verbalized understanding and is agreeable with plan.  Final Clinical Impressions(s) / UC Diagnoses   Final diagnoses:  Lower respiratory infection (e.g., bronchitis, pneumonia, pneumonitis, pulmonitis)     Discharge Instructions     Begin doxycycline twice daily for 10 days Prednisone taper x6 days Tessalon as needed for cough during the day Hycodan at bedtime-will cause drowsiness, do not drive or work after taking May supplement with over-the-counter Mucinex DM for further relief of cough/congestion Rest and fluids Follow-up if not improving or worsening    ED Prescriptions    Medication Sig Dispense Auth. Provider   doxycycline (VIBRAMYCIN) 100 MG capsule Take 1 capsule (100 mg total) by mouth 2 (two) times daily for 10 days. 20 capsule Coleson Kant C, PA-C   benzonatate (TESSALON) 200 MG capsule Take 1 capsule (200 mg total) by mouth 3 (three) times daily as needed for up to 7 days for cough. 28 capsule Leler Brion C, PA-C   HYDROcodone-homatropine (HYCODAN) 5-1.5 MG/5ML syrup Take 5 mLs by mouth at bedtime as needed for cough. 75 mL Roshard Rezabek C, PA-C   predniSONE (DELTASONE) 10 MG tablet Begin with 6 tabs on day 1, 5 tab on day 2, 4 tab on day 3, 3 tab on day 4, 2 tab on day 5, 1 tab on day 6-take  with food 21 tablet Stephenie Navejas C, PA-C     PDMP not reviewed this encounter.   Lew Dawes, PA-C 01/14/20 1316

## 2020-01-14 NOTE — Discharge Instructions (Signed)
Begin doxycycline twice daily for 10 days Prednisone taper x6 days Tessalon as needed for cough during the day Hycodan at bedtime-will cause drowsiness, do not drive or work after taking May supplement with over-the-counter Mucinex DM for further relief of cough/congestion Rest and fluids Follow-up if not improving or worsening

## 2020-01-16 LAB — NOVEL CORONAVIRUS, NAA: SARS-CoV-2, NAA: NOT DETECTED

## 2020-01-16 LAB — SARS-COV-2, NAA 2 DAY TAT

## 2020-11-08 IMAGING — US US ABDOMEN COMPLETE W/ ELASTOGRAPHY
1 series · 12 of 25 positions shown · non-contrast
Comparison: None.

CLINICAL DATA: Non alcoholic steatohepatitis

EXAM:
ULTRASOUND ABDOMEN
ULTRASOUND HEPATIC ELASTOGRAPHY
TECHNIQUE: Sonography of the upper abdomen was performed. In addition,
ultrasound elastography evaluation of the liver was performed. A
region of interest was placed within the right lobe of the liver.
Following application of a compressive sonographic pulse, tissue
compressibility was assessed. Multiple assessments were performed at
the selected site. Median tissue compressibility was determined.
Previously, hepatic stiffness was assessed by shear wave velocity.
Based on recently published Society of Radiologists in Ultrasound
consensus article, reporting is now recommended to be performed in
the SI units of pressure (kiloPascals) representing hepatic
stiffness/elasticity. The obtained result is compared to the
published reference standards. (cACLD= compensated Advanced Chronic
Liver Disease)

[Series 1: us abdomen complete w/ elastography · 0.19mm/px · 12 of 72 slices shown]
[im 3/72]
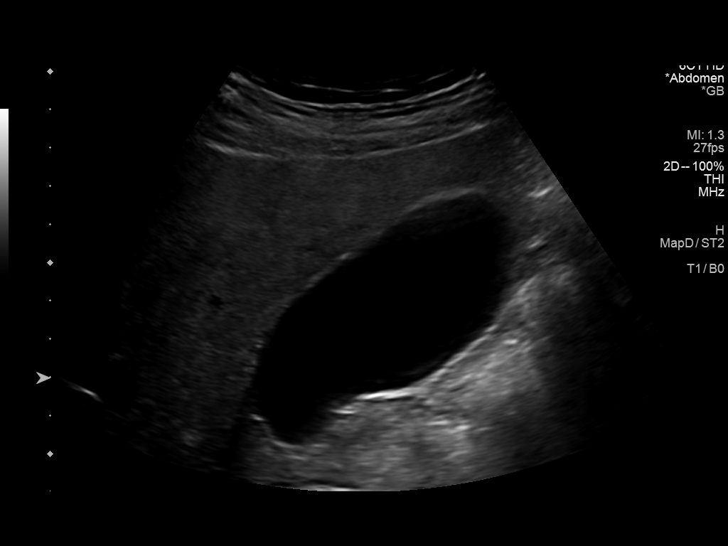
[im 9/72]
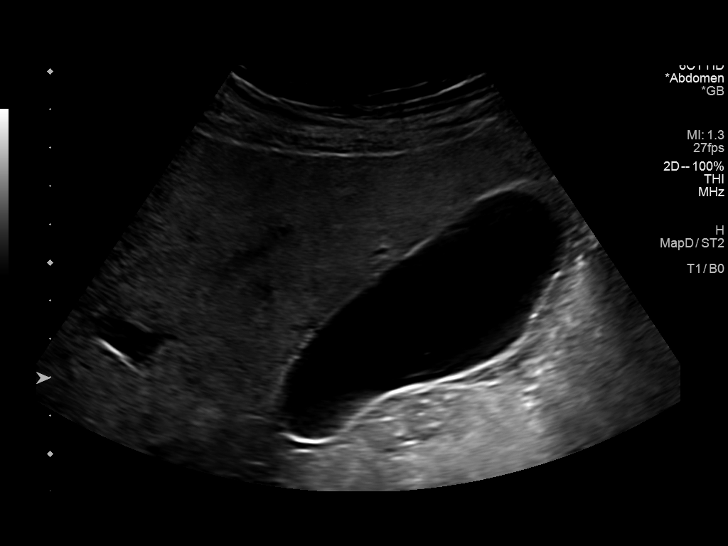
[im 15/72]
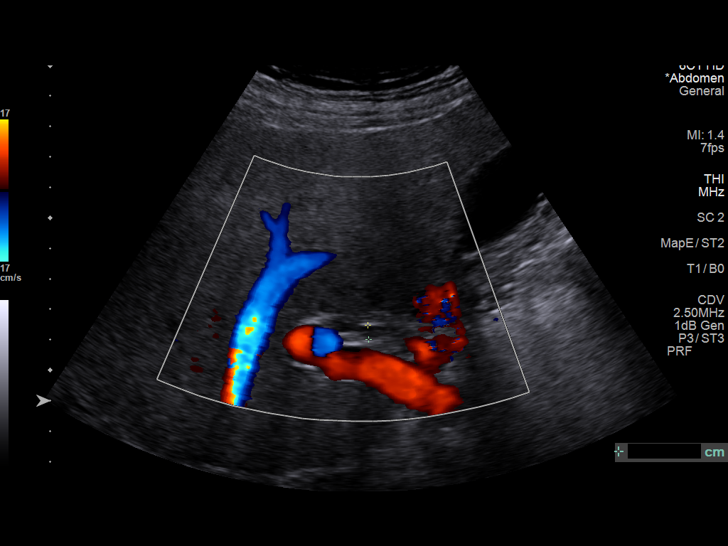
[im 21/72]
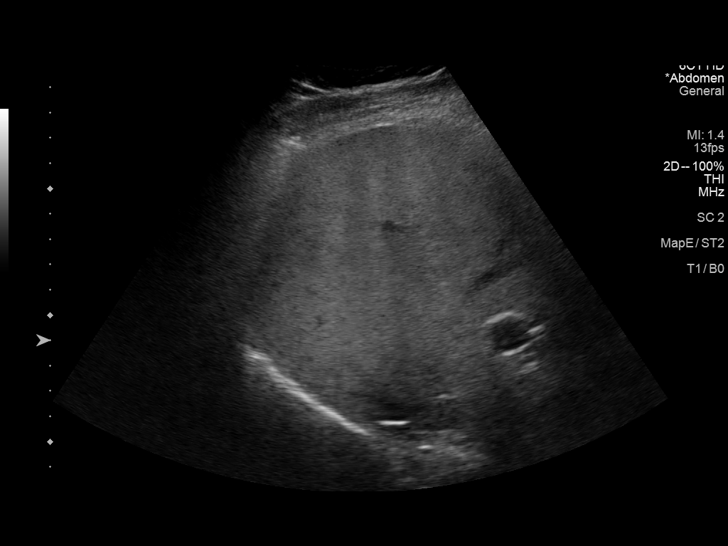
[im 27/72]
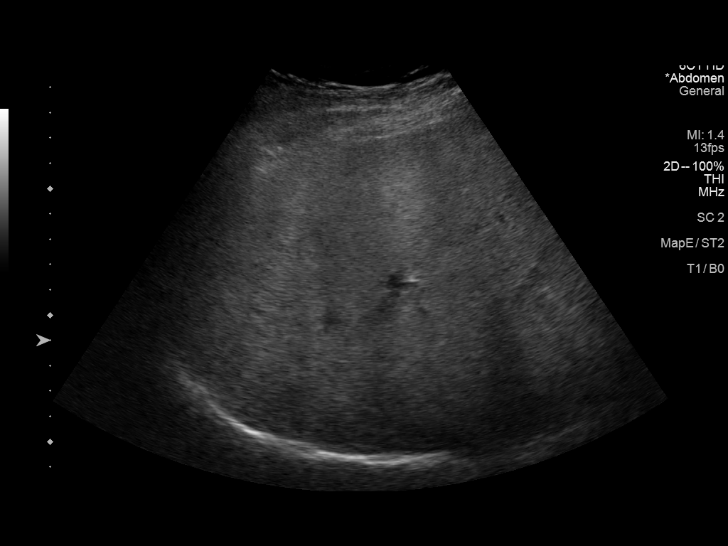
[im 33/72]
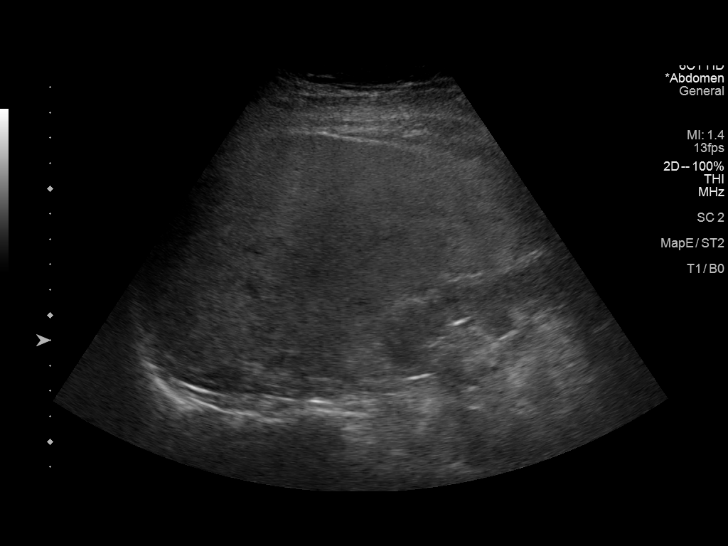
[im 39/72]
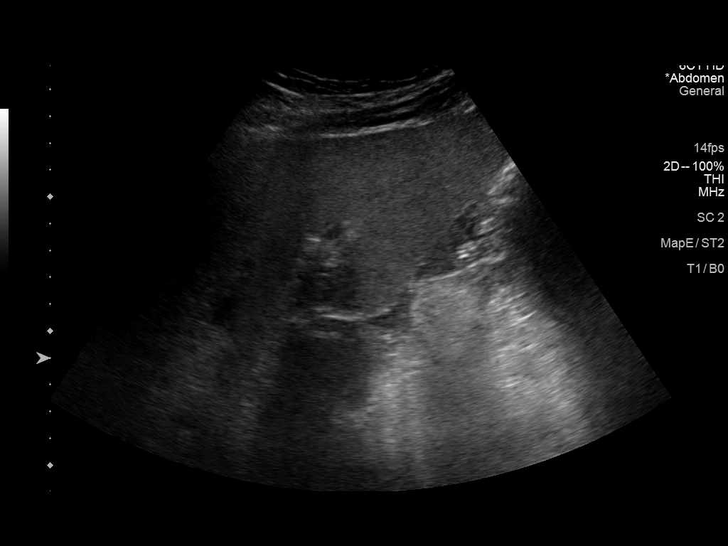
[im 45/72]
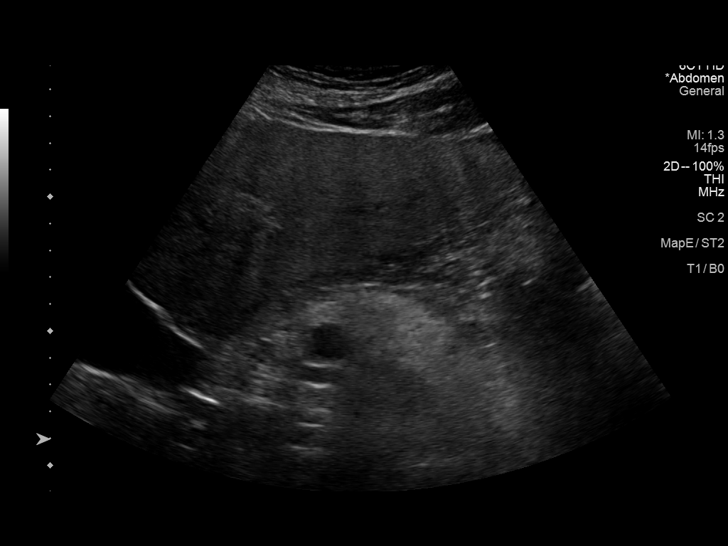
[im 51/72]
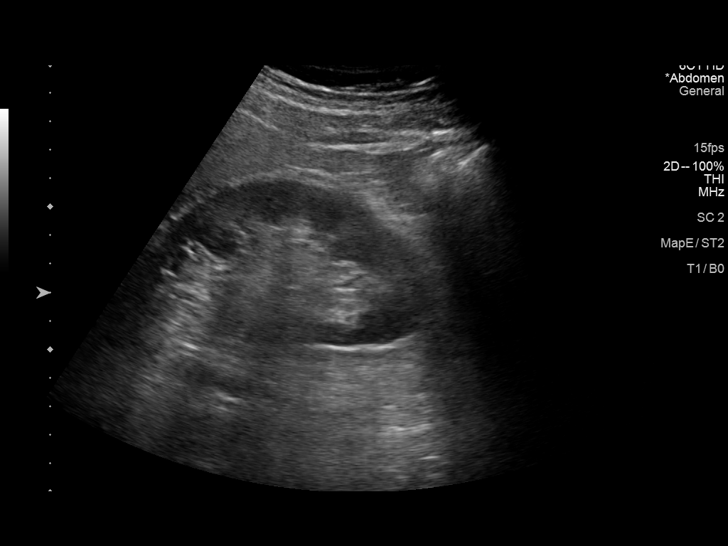
[im 57/72]
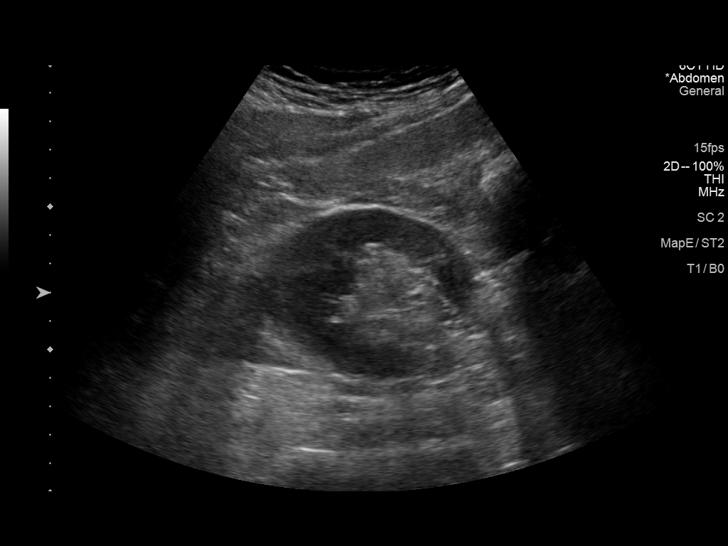
[im 63/72]
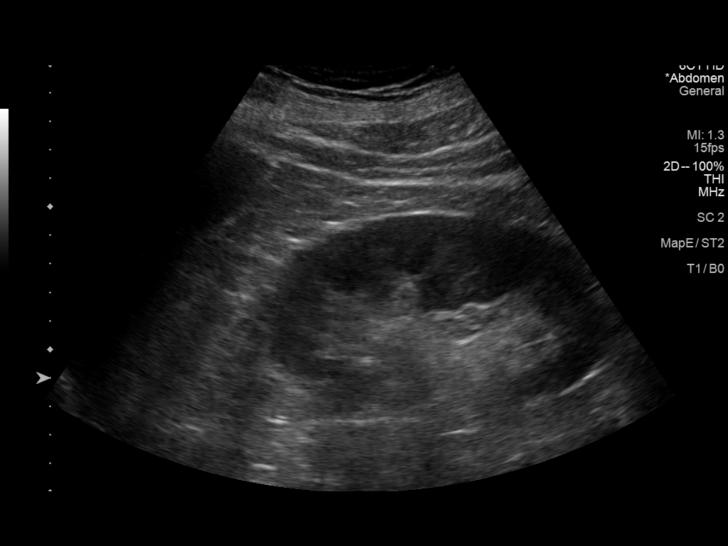
[im 69/72]
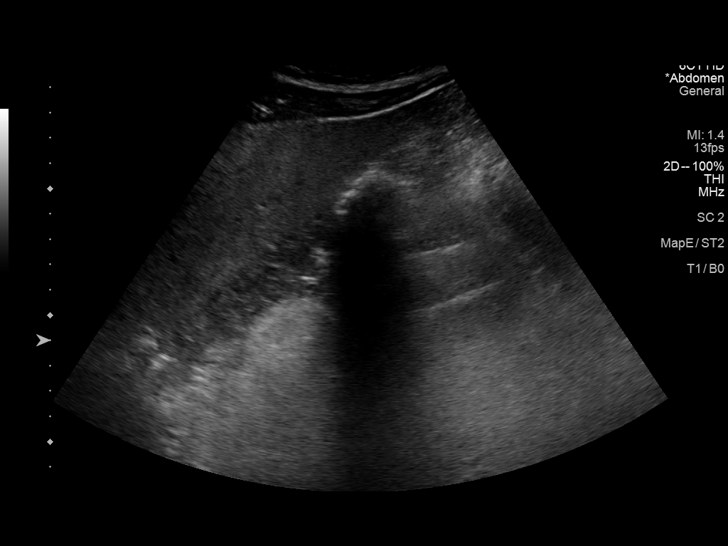

[12 of 25 positions shown; findings below may reference images not displayed]

FINDINGS: ULTRASOUND ABDOMEN

Gallbladder: No gallstones or wall thickening visualized. No
sonographic Murphy sign noted by sonographer.

Common bile duct: Diameter: 4.7 mm

Liver: There is diffuse increased echogenicity of the liver and
decreased through transmission consistent with fatty infiltration.
No focal lesions or biliary dilatation. Portal vein is patent on
color Doppler imaging with normal direction of blood flow towards
the liver.

IVC: Not well seen.

Pancreas: Visualized portion unremarkable.

Spleen: Normal size.  No focal lesions.

Right Kidney: Length: 13.7 cm. Normal renal cortical thickness and
echogenicity without focal lesions or hydronephrosis.

Left Kidney: Length: 13.4 cm. Normal renal cortical thickness and
echogenicity without focal lesions or hydronephrosis.

Abdominal aorta: Not well visualized.

Other findings: None.

ULTRASOUND HEPATIC ELASTOGRAPHY

Device: Siemens Helix VTQ

Patient position: Oblique

Transducer 6C1

Number of measurements: 10

Hepatic segment:  8

Median kPa:

IQR:

IQR/Median kPa ratio:

Data quality: IQR/Median kPa ratio of 0.3 or greater indicates
reduced accuracy

Diagnostic category:  < or = 5 kPa: high probability of being normal
IMPRESSION: ULTRASOUND ABDOMEN:

1. Diffuse fatty infiltration of the liver but no hepatic lesions or
biliary dilatation.
2. Poor visualization of the pancreas, IVC and aorta.

ULTRASOUND HEPATIC ELASTOGRAPHY:

Median kPa:

Diagnostic category:  < or = 5 kPa: high probability of being normal

The use of hepatic elastography is applicable to patients with viral
hepatitis and non-alcoholic fatty liver disease. At this time, there
is insufficient data for the referenced cut-off values and use in
other causes of liver disease, including alcoholic liver disease.
Patients, however, may be assessed by elastography and serve as
their own reference standard/baseline.

In patients with non-alcoholic liver disease, the values suggesting
compensated advanced chronic liver disease (cACLD) may be lower, and
patients may need additional testing with elasticity results of [DATE]
kPa.

Please note that abnormal hepatic elasticity and shear wave
velocities may also be identified in clinical settings other than
with hepatic fibrosis, such as: acute hepatitis, elevated right
heart and central venous pressures including use of beta blockers,
Jerez disease (Janperri), infiltrative processes such as
mastocytosis/amyloidosis/infiltrative tumor/lymphoma, extrahepatic
cholestasis, with hyperemia in the post-prandial state, and with
liver transplantation. Correlation with patient history, laboratory
data, and clinical condition recommended.

Diagnostic Categories:

< or =5 kPa: high probability of being normal

< or =9 kPa: in the absence of other known clinical signs, rules [DATE] kPa and ?13 kPa: suggestive of cACLD, but needs further testing

>13 kPa: highly suggestive of cACLD

< or =17 kPa: highly suggestive of cACLD with an increased
probability of clinically significant portal hypertension

## 2020-11-08 IMAGING — US US ABDOMEN COMPLETE W/ ELASTOGRAPHY
1 series · 12 of 12 positions shown · non-contrast
Comparison: None.

CLINICAL DATA: Non alcoholic steatohepatitis

EXAM:
ULTRASOUND ABDOMEN
ULTRASOUND HEPATIC ELASTOGRAPHY
TECHNIQUE: Sonography of the upper abdomen was performed. In addition,
ultrasound elastography evaluation of the liver was performed. A
region of interest was placed within the right lobe of the liver.
Following application of a compressive sonographic pulse, tissue
compressibility was assessed. Multiple assessments were performed at
the selected site. Median tissue compressibility was determined.
Previously, hepatic stiffness was assessed by shear wave velocity.
Based on recently published Society of Radiologists in Ultrasound
consensus article, reporting is now recommended to be performed in
the SI units of pressure (kiloPascals) representing hepatic
stiffness/elasticity. The obtained result is compared to the
published reference standards. (cACLD= compensated Advanced Chronic
Liver Disease)

[Series 1: us abdomen complete w/ elastography · 0.15mm/px · 12 of 12 slices shown]
[im 1/12]
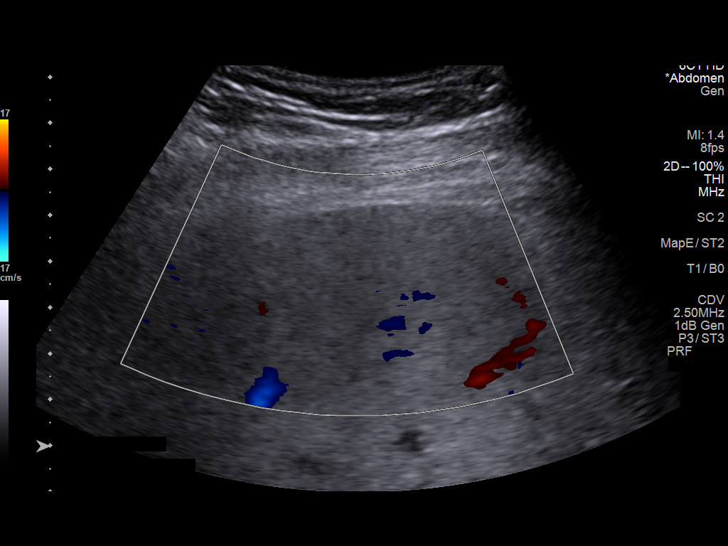
[im 2/12]
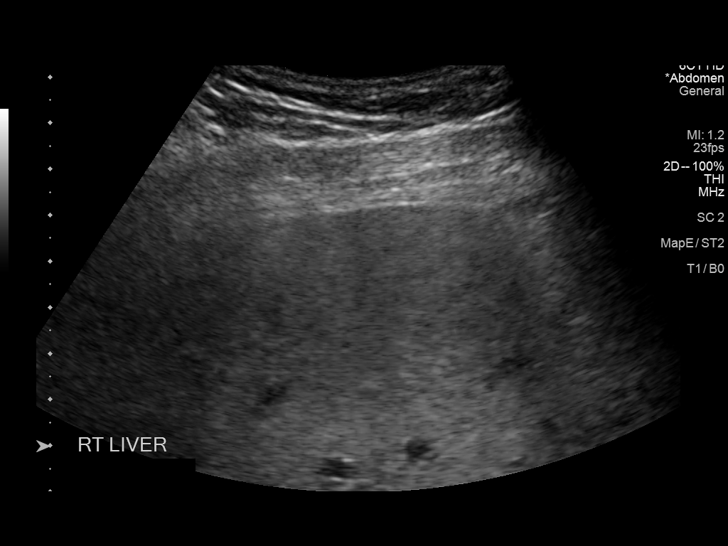
[im 3/12]
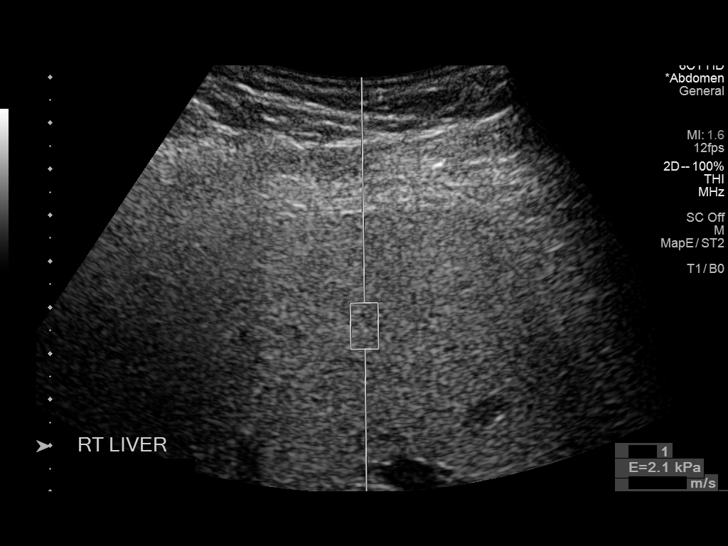
[im 4/12]
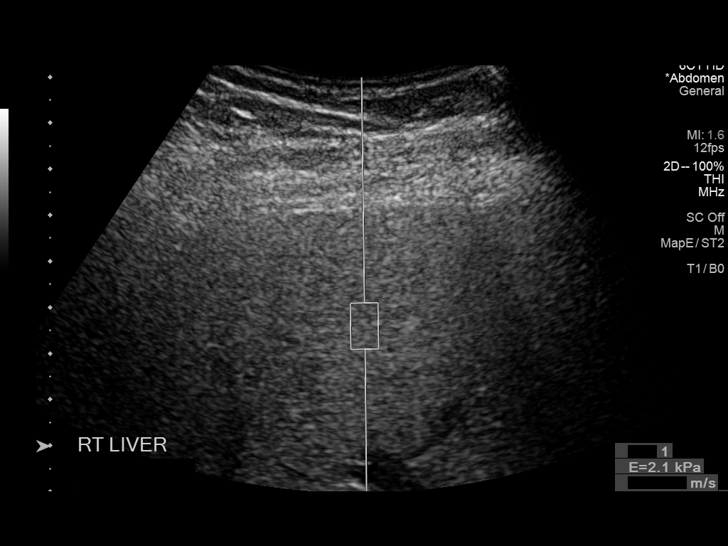
[im 5/12]
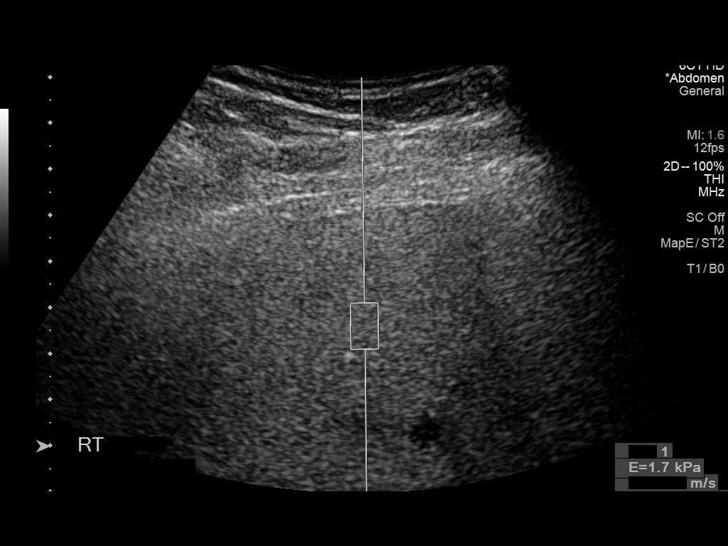
[im 6/12]
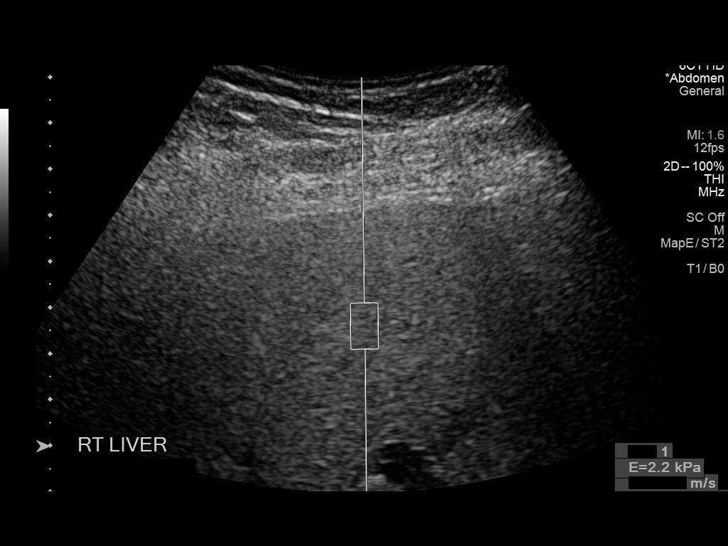
[im 7/12]
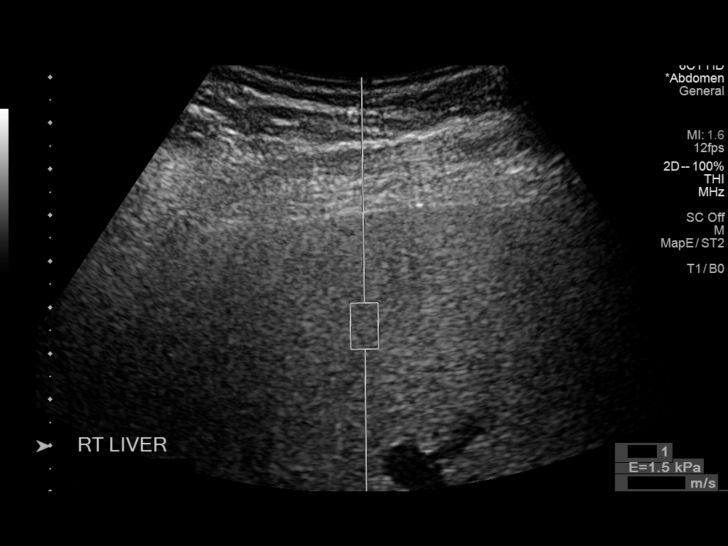
[im 8/12]
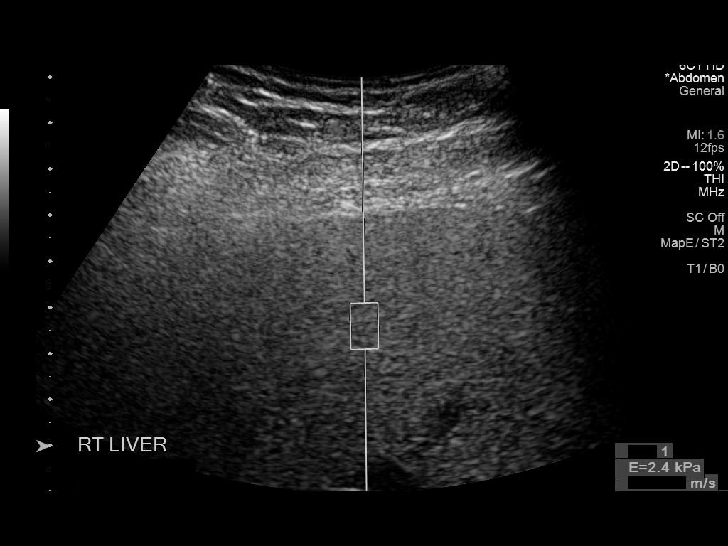
[im 9/12]
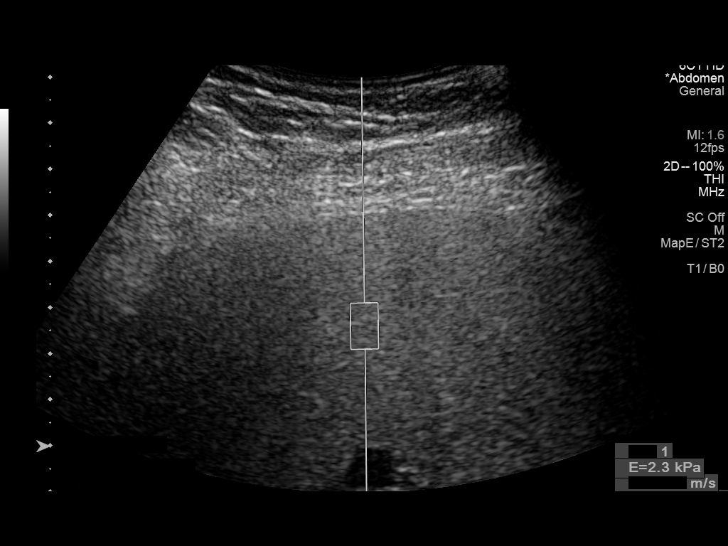
[im 10/12]
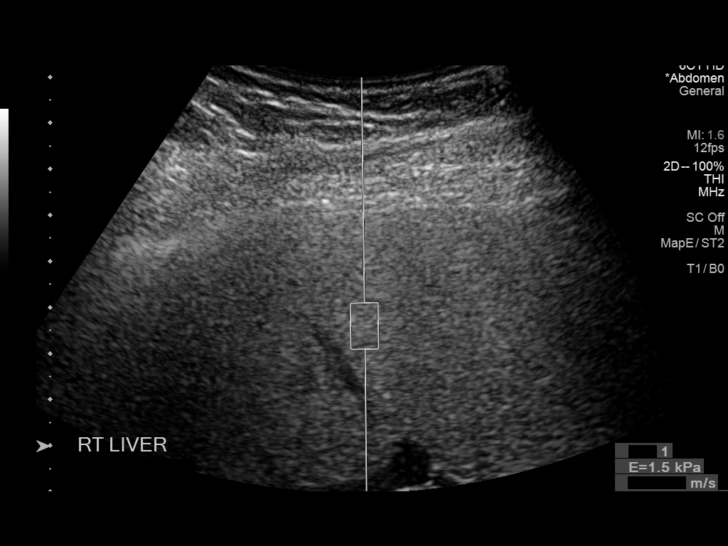
[im 11/12]
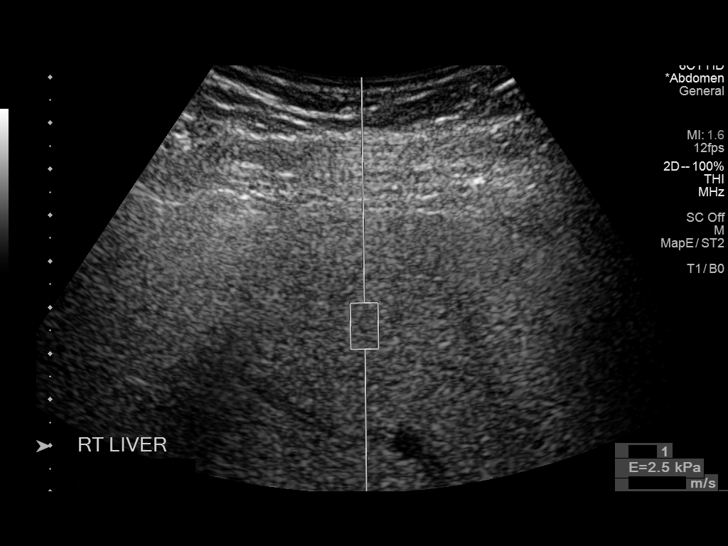
[im 12/12]
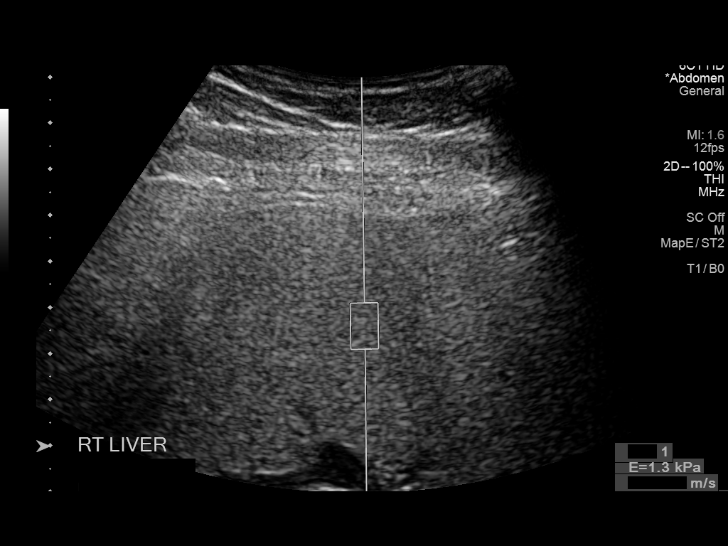

[12 of 12 positions shown; findings below may reference images not displayed]

FINDINGS: ULTRASOUND ABDOMEN

Gallbladder: No gallstones or wall thickening visualized. No
sonographic Murphy sign noted by sonographer.

Common bile duct: Diameter: 4.7 mm

Liver: There is diffuse increased echogenicity of the liver and
decreased through transmission consistent with fatty infiltration.
No focal lesions or biliary dilatation. Portal vein is patent on
color Doppler imaging with normal direction of blood flow towards
the liver.

IVC: Not well seen.

Pancreas: Visualized portion unremarkable.

Spleen: Normal size.  No focal lesions.

Right Kidney: Length: 13.7 cm. Normal renal cortical thickness and
echogenicity without focal lesions or hydronephrosis.

Left Kidney: Length: 13.4 cm. Normal renal cortical thickness and
echogenicity without focal lesions or hydronephrosis.

Abdominal aorta: Not well visualized.

Other findings: None.

ULTRASOUND HEPATIC ELASTOGRAPHY

Device: Siemens Helix VTQ

Patient position: Oblique

Transducer 6C1

Number of measurements: 10

Hepatic segment:  8

Median kPa:

IQR:

IQR/Median kPa ratio:

Data quality: IQR/Median kPa ratio of 0.3 or greater indicates
reduced accuracy

Diagnostic category:  < or = 5 kPa: high probability of being normal
IMPRESSION: ULTRASOUND ABDOMEN:

1. Diffuse fatty infiltration of the liver but no hepatic lesions or
biliary dilatation.
2. Poor visualization of the pancreas, IVC and aorta.

ULTRASOUND HEPATIC ELASTOGRAPHY:

Median kPa:

Diagnostic category:  < or = 5 kPa: high probability of being normal

The use of hepatic elastography is applicable to patients with viral
hepatitis and non-alcoholic fatty liver disease. At this time, there
is insufficient data for the referenced cut-off values and use in
other causes of liver disease, including alcoholic liver disease.
Patients, however, may be assessed by elastography and serve as
their own reference standard/baseline.

In patients with non-alcoholic liver disease, the values suggesting
compensated advanced chronic liver disease (cACLD) may be lower, and
patients may need additional testing with elasticity results of [DATE]
kPa.

Please note that abnormal hepatic elasticity and shear wave
velocities may also be identified in clinical settings other than
with hepatic fibrosis, such as: acute hepatitis, elevated right
heart and central venous pressures including use of beta blockers,
Jerez disease (Janperri), infiltrative processes such as
mastocytosis/amyloidosis/infiltrative tumor/lymphoma, extrahepatic
cholestasis, with hyperemia in the post-prandial state, and with
liver transplantation. Correlation with patient history, laboratory
data, and clinical condition recommended.

Diagnostic Categories:

< or =5 kPa: high probability of being normal

< or =9 kPa: in the absence of other known clinical signs, rules [DATE] kPa and ?13 kPa: suggestive of cACLD, but needs further testing

>13 kPa: highly suggestive of cACLD

< or =17 kPa: highly suggestive of cACLD with an increased
probability of clinically significant portal hypertension

## 2021-02-03 ENCOUNTER — Ambulatory Visit
Admission: EM | Admit: 2021-02-03 | Discharge: 2021-02-03 | Disposition: A | Discharge status: Home / Self Care | Payer: No Typology Code available for payment source | Attending: Emergency Medicine | Admitting: Emergency Medicine

## 2021-02-03 ENCOUNTER — Other Ambulatory Visit: Payer: Self-pay

## 2021-02-03 ENCOUNTER — Other Ambulatory Visit (HOSPITAL_COMMUNITY): Payer: Self-pay

## 2021-02-03 DIAGNOSIS — B9689 Other specified bacterial agents as the cause of diseases classified elsewhere: Secondary | ICD-10-CM | POA: Diagnosis not present

## 2021-02-03 DIAGNOSIS — J329 Chronic sinusitis, unspecified: Secondary | ICD-10-CM | POA: Diagnosis not present

## 2021-02-03 DIAGNOSIS — H66001 Acute suppurative otitis media without spontaneous rupture of ear drum, right ear: Secondary | ICD-10-CM | POA: Diagnosis not present

## 2021-02-03 DIAGNOSIS — J189 Pneumonia, unspecified organism: Secondary | ICD-10-CM | POA: Diagnosis not present

## 2021-02-03 DIAGNOSIS — R058 Other specified cough: Secondary | ICD-10-CM

## 2021-02-03 MED ORDER — AEROCHAMBER PLUS FLO-VU LARGE MISC
1.0000 | Freq: Once | 0 refills | Status: AC
  Filled 2021-02-03: qty 1, 1d supply, fill #0

## 2021-02-03 MED ORDER — AMOXICILLIN-POT CLAVULANATE 875-125 MG PO TABS
1.0000 | ORAL_TABLET | Freq: Two times a day (BID) | ORAL | 0 refills | Status: AC
  Filled 2021-02-03: qty 20, 10d supply, fill #0

## 2021-02-03 MED ORDER — PROMETHAZINE-DM 6.25-15 MG/5ML PO SYRP
5.0000 mL | ORAL_SOLUTION | Freq: Four times a day (QID) | ORAL | 0 refills | Status: DC | PRN
  Filled 2021-02-03: qty 180, 9d supply, fill #0

## 2021-02-03 MED ORDER — ALBUTEROL SULFATE HFA 108 (90 BASE) MCG/ACT IN AERS
2.0000 | INHALATION_SPRAY | Freq: Four times a day (QID) | RESPIRATORY_TRACT | 0 refills | Status: AC | PRN
  Filled 2021-02-03: qty 18, 25d supply, fill #0

## 2021-02-03 MED ORDER — IPRATROPIUM BROMIDE 0.06 % NA SOLN
2.0000 | Freq: Four times a day (QID) | NASAL | 0 refills | Status: AC
  Filled 2021-02-03: qty 15, 19d supply, fill #0

## 2021-02-03 MED ORDER — IBUPROFEN 600 MG PO TABS
600.0000 mg | ORAL_TABLET | Freq: Three times a day (TID) | ORAL | 0 refills | Status: AC | PRN
  Filled 2021-02-03: qty 30, 10d supply, fill #0

## 2021-02-03 MED ORDER — GUAIFENESIN 400 MG PO TABS
ORAL_TABLET | ORAL | 0 refills | Status: DC
  Filled 2021-02-03: qty 21, 7d supply, fill #0

## 2021-02-03 NOTE — ED Provider Notes (Signed)
[facility]    CSN: [id number] Arrival date & time: 02/03/21  1119    HISTORY   Chief Complaint  Patient presents with   Cough    Congestion    HPI [name] is a 61 y.o. male. Patient reports a 2-day history of cough productive of purulent sputum, states his initial symptom was intense sinus pressure with purulent sinus drainage.  Patient states he has been taking over-the-counter cough medication with no relief.  Patient denies fever, nausea, vomiting, diarrhea.  Patient endorses continued sinus pain and pressure which is slightly improved from previous.  Patient denies frank shortness of breath.  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  EMR reviewed, patient was here about a year ago for similar symptoms which required treatment with antibiotics and steroids.  Patient is afebrile on arrival today with excellent oxygenation, diastolic blood pressure slightly elevated.  Patient is ill-appearing per my observation.  Patient states he has not slept in a few nights secondary to cough.  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  The history is provided by the patient.  Past Medical History:  Diagnosis Date   Arthritis    HANDS KNEES   High cholesterol    Hypertension    There are no problems to display for this patient.  Past Surgical History:  Procedure Laterality Date   ANKLE RECONSTRUCTION  04/02/2011   Procedure: RECONSTRUCTION ANKLE;  Surgeon: Ninetta Lights, MD;  Location: [facility];  Service: Orthopedics;  Laterality: Right;  right ankle arthroscopy with debridement chondroplasty microfracture, resection of portion of peroneal tendon.   APPENDECTOMY     HAND SURGERY     IR US GUIDE BX ASP/DRAIN  09/27/2017    Home Medications    Prior to  Admission medications   Medication Sig Start Date End Date Taking? Authorizing Provider  Ascorbic Acid (VITAMIN C) 100 MG tablet Take 100 mg by mouth daily.    [provider]  HYDROcodone-homatropine (HYCODAN) 5-1.5 MG/5ML syrup Take 5 mLs by mouth at bedtime as needed for cough. 01/14/20   Wieters, Hallie C, PA-C  Multiple Vitamin (MULITIVITAMIN WITH MINERALS) TABS Take 1 tablet by mouth daily.    [provider]  Olmesartan-amLODIPine-HCTZ 40-10-12.5 MG TABS Take by mouth.    [provider]  Omega-3 Fatty Acids (FISH OIL) 1000 MG CAPS Take by mouth.    [provider]   Family History Family History  Problem Relation Age of Onset   Lung cancer Father    Social History Social History   Tobacco Use   Smoking status: Former   Smokeless tobacco: Never  Scientific laboratory technician Use: Never used  Substance Use Topics   Alcohol use: Yes    Comment: occassional   Drug use: No   Allergies   Lisinopril  Review of Systems Review of Systems Pertinent findings noted in history of present illness.   Physical Exam Triage Vital Signs ED Triage Vitals  Enc Vitals Group     BP 11/29/20 0827 (!) 147/82     Pulse Rate 11/29/20 0827 72     Resp 11/29/20 0827 18     Temp 11/29/20 0827 98.3 F (36.8 C)     Temp Source 11/29/20 0827 Oral  SpO2 11/29/20 0827 98 %     Weight --      Height --      Head Circumference --      Peak Flow --      Pain Score 11/29/20 0826 5     Pain Loc --      Pain Edu? --      Excl. in GC? --   No data found.  Updated Vital Signs BP (!) 123/96 (BP Location: Left Arm)    Pulse 89    Temp 98.1 F (36.7 C) (Oral)    Resp 18    SpO2 100%   Physical Exam Constitutional:      Appearance: He is ill-appearing.  HENT:     Head: Normocephalic and atraumatic.     Salivary Glands: Right salivary gland is not diffusely enlarged or tender. Left salivary gland is not diffusely enlarged or tender.     Right Ear: External ear  normal. There is impacted cerumen (Suppurative). Tympanic membrane is injected.     Left Ear: Tympanic membrane, ear canal and external ear normal.     Ears:     Comments: Left EAC with erythema    Nose: Congestion and rhinorrhea present. Rhinorrhea is clear.     Right Sinus: No maxillary sinus tenderness or frontal sinus tenderness.     Left Sinus: No maxillary sinus tenderness.     Mouth/Throat:     Mouth: Mucous membranes are moist.     Pharynx: Pharyngeal swelling, posterior oropharyngeal erythema and uvula swelling present.     Tonsils: No tonsillar exudate. 2+ on the right. 2+ on the left.  Cardiovascular:     Rate and Rhythm: Normal rate and regular rhythm.     Pulses: Normal pulses.  Pulmonary:     Effort: Pulmonary effort is normal. Prolonged expiration present. No tachypnea, bradypnea, accessory muscle usage, respiratory distress or retractions.     Breath sounds: No stridor. Examination of the left-upper field reveals rales. Examination of the left-middle field reveals decreased breath sounds. Examination of the left-lower field reveals decreased breath sounds. Decreased breath sounds and rales present. No wheezing or rhonchi.     Comments: Turbulent breath sounds throughout without wheeze, rale, rhonchi. Abdominal:     General: Abdomen is flat. Bowel sounds are normal.     Palpations: Abdomen is soft.  Musculoskeletal:        General: Normal range of motion.  Lymphadenopathy:     Cervical: Cervical adenopathy present.     Right cervical: Superficial cervical adenopathy and posterior cervical adenopathy present.     Left cervical: Superficial cervical adenopathy and posterior cervical adenopathy present.  Skin:    General: Skin is warm and dry.     Comments: Skin warmer to touch than measured oral temperature  Neurological:     General: No focal deficit present.     Mental Status: He is alert and oriented to person, place, and time.     Motor: Motor function is intact.      Coordination: Coordination is intact.     Gait: Gait is intact.     Deep Tendon Reflexes: Reflexes are normal and symmetric.  Psychiatric:        Attention and Perception: Attention and perception normal.        Mood and Affect: Mood and affect normal.        Speech: Speech normal.        Behavior: Behavior normal. Behavior is cooperative.  Thought Content: Thought content normal.    Visual Acuity Right Eye Distance:   Left Eye Distance:   Bilateral Distance:    Right Eye Near:   Left Eye Near:    Bilateral Near:     UC Couse / Diagnostics / Procedures:    EKG  Radiology No results found.  Procedures Procedures (including critical care time)  UC Diagnoses / Final Clinical Impressions(s)   I have reviewed the triage vital signs and the nursing notes.  Pertinent labs & imaging results that were available during my care of the patient were reviewed by me and considered in my medical decision making (see chart for details).   Final diagnoses:  Bacterial sinusitis  Community acquired pneumonia of left lower lobe of lung  Cough productive of purulent sputum  Acute suppurative otitis media of right ear   Physical exam findings concerning for pneumonia, acute otitis media, bacterial sinusitis and possible tonsillitis given enlargement tonsils which are also quite erythematous.  Begin Augmentin for lung, ear, sinus and possible tonsil infection.  Promethazine DM prescribed for nighttime cough.  Atrovent nasal spray provided to open up mucous membranes to allow sinuses to drain.  Guaifenesin recommended to help patient expectorate more efficiently.  Also recommend ibuprofen 600 mg 3 times daily for the next few days to reduce inflammation in upper and lower airways.  Return precautions advised. ED Prescriptions     Medication Sig Dispense Auth. Provider   amoxicillin-clavulanate (AUGMENTIN) 875-125 MG tablet Take 1 tablet by mouth 2 (two) times daily for 10 days. 20 tablet  Lynden Oxford Scales, PA-C   albuterol (VENTOLIN HFA) 108 (90 Base) MCG/ACT inhaler Inhale 2 puffs into the lungs every 6 (six) hours as needed for wheezing or shortness of breath (Cough). 18 g Lynden Oxford Scales, PA-C   Spacer/Aero-Holding Chambers (AEROCHAMBER PLUS FLO-VU LARGE) MISC Use with inhaler as directed. 1 each Lynden Oxford Scales, PA-C   guaifenesin (HUMIBID E) 400 MG TABS tablet Take 1 tablet 3 times daily as needed for chest congestion and cough 21 tablet Lynden Oxford Scales, PA-C   promethazine-dextromethorphan (PROMETHAZINE-DM) 6.25-15 MG/5ML syrup Take 5 mLs by mouth 4 (four) times daily as needed for cough. 180 mL Lynden Oxford Scales, PA-C   ipratropium (ATROVENT) 0.06 % nasal spray Place 2 sprays into both nostrils 4 (four) times daily. As needed for nasal congestion, runny nose 15 mL Lynden Oxford Scales, PA-C   ibuprofen (ADVIL) 600 MG tablet Take 1 tablet (600 mg total) by mouth every 8 (eight) hours as needed for up to 30 doses for fever, headache, mild pain or moderate pain (Inflammation). Take 1 tablet 3 times daily as needed for inflammation of upper airways and/or pain. 30 tablet Lynden Oxford Scales, PA-C      PDMP not reviewed this encounter.  Pending results:  Labs Reviewed - No data to display  Medications Ordered in UC: Medications - No data to display  Disposition Upon Discharge:  Condition: stable for discharge home Home: take medications as prescribed; routine discharge instructions as discussed; follow up as advised.  Patient presented with an acute illness with associated systemic symptoms and significant discomfort requiring urgent management. In my opinion, this is a condition that a prudent lay person (someone who possesses an average knowledge of health and medicine) may potentially expect to result in complications if not addressed urgently such as respiratory distress, impairment of bodily function or dysfunction of bodily organs.    Routine symptom specific, illness specific and/or disease specific  instructions were discussed with the patient and/or caregiver at length.   As such, the patient has been evaluated and assessed, work-up was performed and treatment was provided in alignment with urgent care protocols and evidence based medicine.  Patient/parent/caregiver has been advised that the patient may require follow up for further testing and treatment if the symptoms continue in spite of treatment, as clinically indicated and appropriate.  If the patient was tested for COVID-19, Influenza and/or RSV, then the patient/parent/guardian was advised to isolate at home pending the results of his/her diagnostic coronavirus test and potentially longer if theyre positive. I have also advised pt that if his/her COVID-19 test returns positive, it's recommended to self-isolate for at least 10 days after symptoms first appeared AND until fever-free for 24 hours without fever reducer AND other symptoms have improved or resolved. Discussed self-isolation recommendations as well as instructions for household member/close contacts as per the [facility] and [place] DHHS, and also gave patient the [place] packet with this information.  Patient/parent/caregiver has been advised to return to the [facility] [place] or PCP in 3-5 days if no better; to PCP or the Emergency Department if new signs and symptoms develop, or if the current signs or symptoms continue to change or worsen for further workup, evaluation and treatment as clinically indicated and appropriate  The patient will follow up with their current PCP if and as advised. If the patient does not currently have a PCP we will assist them in obtaining one.   The patient may need specialty follow up if the symptoms continue, in spite of conservative treatment and management, for further workup, evaluation, consultation and treatment as clinically indicated and appropriate.  Patient/parent/caregiver verbalized  understanding and agreement of plan as discussed.  All questions were addressed during visit.  Please see discharge instructions below for further details of plan.  Discharge Instructions:   Discharge Instructions      Based on the history provided to me today along with my physical exam findings, I believe that you are suffering from multiple bacterial infections including ear infection, sinus infection, lung infection and possible tonsil infection.  Fortunately, the antibiotic I have prescribed for you, Augmentin, will cover all of these.  Please take 1 tablet twice daily for the next 10 days.    As we discussed, the most common side effect of antibiotics is diarrhea, this is usually fairly well managed with a bland diet, probiotics, plenty of rehydrating fluid and rest.  Once you have completed the full course of antibiotics, the diarrhea will resolve.  Please see the list below for other prescribed medications, dosages and frequencies to provide relief of your current symptoms:     Ibuprofen  (Advil, Motrin): This is a good anti-inflammatory medication which addresses aches and pains and inflammation of the upper airways that causes sinus and nasal congestion as well as in the lower airways which makes your cough feel tight and sometimes burn.  I recommend that you take between 400 to 600 mg every 6-8 hours as needed.      Guaifenesin (Robitussin, Mucinex): This is an expectorant.  This helps break up chest congestion and loosen up thick nasal drainage making phlegm and drainage more liquid and therefore easier to remove.  I recommend being 400 mg three times daily as needed.      Promethazine DM: Promethazine is both the nasal decongestant and an antinausea medication that makes most patients feel fairly sleepy.  The DM is dextromethorphan, a cough suppressant found many over-the-counter  cough medications.  Please take 5 mL before bedtime to help you sleep better, minimize your cough.  I have  provided you with a prescription for this medication.      Ipratropium (Atrovent): This is an excellent nasal decongestant spray that does not cause rebound congestion, can be used up to 4 times daily as needed, instill 2 sprays into each nare with each use.  I have provided you with a prescription for this medication.      Albuterol HFA: This is a bronchodilator, it relaxes the smooth muscles that constrict your airway in your lungs when you are feeling sick or having inflammation secondary to allergies.  Please inhale 2 puffs twice daily every day using the spacer provided.  You can also inhale 2 more puffs as often as needed throughout the day for aggravating cough, chest tightness, feeling short of breath, wheezing.      Conservative care is also recommended at this time.  This includes rest, pushing clear fluids and activity as tolerated.  Warm beverages such as teas and broths versus cold beverages/popsicles and frozen sherbet/sorbet are personal choice, both warm and cold are beneficial.  You may also notice that your appetite is reduced; this is okay as long as you are drinking plenty of clear fluids.    I have provided you with a note to be out of work for the next 3 days.  Please follow-up within the next 3 to 5 days either with your primary care provider or urgent care if your symptoms do not resolve.  If you do not have a primary care provider, we will assist you in finding one.      This office note has been dictated using Museum/gallery curator.  Unfortunately, and despite my best efforts, this method of dictation can sometimes lead to occasional typographical or grammatical errors.  I apologize in advance if this occurs.      Lynden Oxford Scales, PA-C 02/03/21 1314

## 2021-02-03 NOTE — Discharge Instructions (Signed)
Based on the history provided to me today along with my physical exam findings, I believe that you are suffering from multiple bacterial infections including ear infection, sinus infection, lung infection and possible tonsil infection.  Fortunately, the antibiotic I have prescribed for you, Augmentin, will cover all of these.  Please take 1 tablet twice daily for the next 10 days.    As we discussed, the most common side effect of antibiotics is diarrhea, this is usually fairly well managed with a bland diet, probiotics, plenty of rehydrating fluid and rest.  Once you have completed the full course of antibiotics, the diarrhea will resolve.  Please see the list below for other prescribed medications, dosages and frequencies to provide relief of your current symptoms:     Ibuprofen  (Advil, Motrin): This is a good anti-inflammatory medication which addresses aches and pains and inflammation of the upper airways that causes sinus and nasal congestion as well as in the lower airways which makes your cough feel tight and sometimes burn.  I recommend that you take between 400 to 600 mg every 6-8 hours as needed.      Guaifenesin (Robitussin, Mucinex): This is an expectorant.  This helps break up chest congestion and loosen up thick nasal drainage making phlegm and drainage more liquid and therefore easier to remove.  I recommend being 400 mg three times daily as needed.      Promethazine DM: Promethazine is both the nasal decongestant and an antinausea medication that makes most patients feel fairly sleepy.  The DM is dextromethorphan, a cough suppressant found many over-the-counter cough medications.  Please take 5 mL before bedtime to help you sleep better, minimize your cough.  I have provided you with a prescription for this medication.      Ipratropium (Atrovent): This is an excellent nasal decongestant spray that does not cause rebound congestion, can be used up to 4 times daily as needed, instill 2  sprays into each nare with each use.  I have provided you with a prescription for this medication.      Albuterol HFA: This is a bronchodilator, it relaxes the smooth muscles that constrict your airway in your lungs when you are feeling sick or having inflammation secondary to allergies.  Please inhale 2 puffs twice daily every day using the spacer provided.  You can also inhale 2 more puffs as often as needed throughout the day for aggravating cough, chest tightness, feeling short of breath, wheezing.      Conservative care is also recommended at this time.  This includes rest, pushing clear fluids and activity as tolerated.  Warm beverages such as teas and broths versus cold beverages/popsicles and frozen sherbet/sorbet are personal choice, both warm and cold are beneficial.  You may also notice that your appetite is reduced; this is okay as long as you are drinking plenty of clear fluids.    I have provided you with a note to be out of work for the next 3 days.  Please follow-up within the next 3 to 5 days either with your primary care provider or urgent care if your symptoms do not resolve.  If you do not have a primary care provider, we will assist you in finding one.

## 2021-02-03 NOTE — ED Triage Notes (Signed)
Pt presents to the office for cough and congestion x 2 days. He is taking OTC cough and cold.

## 2021-02-04 ENCOUNTER — Ambulatory Visit: Payer: Self-pay

## 2021-11-21 ENCOUNTER — Encounter: Payer: Self-pay | Admitting: Neurology

## 2021-12-29 NOTE — Progress Notes (Deleted)
Assessment/Plan:   1.  Tremor  -Certainly sounds like he has a lifelong history of essential tremor  -Long discussion with the patient regarding the complex effect that alcohol can have on tremor.  Chronic alcohol use can produce a tremor but discontinuation of the alcohol can also cause a tremulous state for quite some time.  We talked about the importance of weaning alcohol under medical supervision.  We talked about the fact that rapid discontinuation can cause withdrawal seizure, which is why medical supervision is of the upmost importance.    -Treatment for tremor is difficult given the above.  I would really like to see the complete cessation of alcohol, so that we could really see what kind of degree of tremor we are left with.  Primidone is ideal, but is metabolized through the liver and I can see he has both alcohol and nonalcohol related fatty liver.  We could probably use this medication and low dosage, but  2.  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  -Following with GI.  -Has evidence of liver cirrhosis per PCP records.   Subjective:   [name] was seen in consultation in the movement disorder clinic at the request of Lupita Raider, MD.  The evaluation is for tremor.  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  Tremor started in childhood and involves the ***.  Tremor is most noticeable when ***.   There is *** family hx of tremor.    Affected by caffeine:  {yes no:314532} Affected by alcohol:  {yes no:314532} Affected by stress:  {yes no:314532} Affected by fatigue:  {yes no:314532} Spills soup if on spoon:  {yes no:314532} Spills glass of liquid if full:  {yes no:314532} Affects ADL's (tying shoes, brushing teeth,  etc):  {yes no:314532}  Current/Previously tried tremor medications: ***  Current medications that may exacerbate tremor:  ***  Outside reports reviewed: {Outside review:15817}.  Allergies  Allergen Reactions   Lisinopril     No outpatient medications have been marked as taking for the 12/31/21 encounter (Appointment) with Kynley Metzger, Octaviano Batty, DO.      Objective:   VITALS:  There were no vitals filed for this visit. Gen:  Appears stated age and in NAD. HEENT:  Normocephalic, atraumatic. The mucous membranes are moist. The superficial temporal arteries are without ropiness or tenderness. Cardiovascular: Regular rate and rhythm. Lungs: Clear to auscultation bilaterally. Neck: There are no carotid bruits noted bilaterally.  NEUROLOGICAL:  Orientation:  The patient is alert and oriented x 3.   Cranial nerves: There is good facial symmetry. Extraocular muscles are intact and visual fields are full to confrontational testing. Speech is fluent and clear. Soft palate rises symmetrically and there is no tongue deviation. Hearing is intact to conversational tone. Tone: Tone is good throughout. Sensation: Sensation is intact to light touch touch throughout (facial, trunk, extremities). Vibration is intact at the bilateral big toe. There is no extinction with double simultaneous stimulation. There is no sensory dermatomal level identified. Coordination:  The patient has no dysdiadichokinesia or dysmetria. Motor: Strength is 5/5 in the bilateral upper and lower extremities.  Shoulder shrug is equal bilaterally.  There is no pronator drift.  There are no fasciculations noted. DTR's: Deep tendon reflexes are 2/4 at the bilateral biceps, triceps, brachioradialis, patella and  achilles.  Plantar responses are downgoing bilaterally. Gait and Station: The patient is able to ambulate without difficulty. The patient is able to heel toe walk without any difficulty. The patient is able to ambulate in a tandem  fashion. The patient is able to stand in the Romberg position.   MOVEMENT EXAM: Tremor:  There is *** tremor in the UE, noted most significantly with action.  The patient is *** able to draw Archimedes spirals without significant difficulty.  There is *** tremor at rest.  The patient is *** able to pour water from one glass to another without spilling it.  I have reviewed and interpreted the following labs independently   Chemistry      Component Value Date/Time   NA 135 03/31/2011 0900   K 4.5 03/31/2011 0900   CL 102 03/31/2011 0900   CO2 24 03/31/2011 0900   BUN 12 03/31/2011 0900   CREATININE 0.75 03/31/2011 0900      Component Value Date/Time   CALCIUM 9.9 03/31/2011 0900      Lab Results  Component Value Date   WBC 6.5 09/27/2017   HGB 15.6 09/27/2017   HCT 45.3 09/27/2017   MCV 94.6 09/27/2017   PLT 249 09/27/2017   No results found for: "TSH"  Labs via primary care were done December 17, 2021.  Sodium was 137, potassium 3.5, chloride 99, CO2 28, AST elevated at 70, ALT at 104. Total time spent on today's visit was ***60 minutes, including both face-to-face time and nonface-to-face time.  Time included that spent on review of records (prior notes available to me/labs/imaging if pertinent), discussing treatment and goals, answering patient's questions and coordinating care.  CC:  Lupita Raider, MD

## 2021-12-31 ENCOUNTER — Ambulatory Visit: Payer: No Typology Code available for payment source | Admitting: Neurology

## 2022-02-06 NOTE — Progress Notes (Unsigned)
Assessment/Plan:   1.  Tremor             -Certainly sounds like he has a lifelong history of essential tremor             -Long discussion with the patient regarding the complex effect that alcohol can have on tremor.  Chronic alcohol use can produce a tremor but discontinuation of the alcohol can also cause a tremulous state for quite some time.  We talked about the importance of weaning alcohol under medical supervision.  We talked about the fact that rapid discontinuation can cause withdrawal seizure, which is why medical supervision is of the upmost importance.               -Treatment for tremor is difficult given the above.  I would really like to see the complete cessation of alcohol, so that we could really see what kind of degree of tremor we are left with.  Primidone is ideal, but is metabolized through the liver and I can see he has both alcohol and nonalcohol related fatty liver.  We could probably use this medication and low dosage, but   2.  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             -Following with GI.             -Has evidence of liver cirrhosis per PCP records.   Subjective:   [name] was seen in consultation in the movement disorder clinic at the request of Mayra Neer, MD.  The evaluation is for tremor.  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  Tremor started in childhood and involves the ***.  Tremor is most noticeable when ***.   There is *** family hx of tremor.    Affected by caffeine:  {yes no:314532} Affected by alcohol:  {yes no:314532} Affected by stress:  {yes no:314532} Affected by fatigue:  {yes no:314532} Spills soup if on spoon:  {yes no:314532} Spills glass of liquid if  full:  {yes no:314532} Affects ADL's (tying shoes, brushing teeth, etc):  {yes no:314532}  Current/Previously tried tremor medications: ***  Current medications that may exacerbate tremor:  ***  Outside reports reviewed: {Outside review:15817}.  Allergies  Allergen Reactions   Lisinopril     No outpatient medications have been marked as taking for the 02/10/22 encounter (Appointment) with Zaide Mcclenahan, Eustace Quail, DO.      Objective:   VITALS:  There were no vitals filed for this visit. Gen:  Appears stated age and in NAD. HEENT:  Normocephalic, atraumatic. The mucous membranes are moist. The superficial temporal arteries are without ropiness or tenderness. Cardiovascular: Regular rate and rhythm. Lungs: Clear to auscultation bilaterally. Neck: There are no carotid bruits noted bilaterally.  NEUROLOGICAL:  Orientation:  The patient is alert and oriented x 3.   Cranial nerves: There is good facial symmetry. Extraocular muscles are intact and visual fields are full to confrontational testing. Speech is fluent and clear. Soft palate rises symmetrically and there is no tongue deviation. Hearing is intact to conversational tone. Tone: Tone is good throughout. Sensation: Sensation is intact to light touch touch throughout (facial, trunk, extremities). Vibration is intact at the bilateral big toe.  There is no extinction with double simultaneous stimulation. There is no sensory dermatomal level identified. Coordination:  The patient has no dysdiadichokinesia or dysmetria. Motor: Strength is 5/5 in the bilateral upper and lower extremities.  Shoulder shrug is equal bilaterally.  There is no pronator drift.  There are no fasciculations noted. DTR's: Deep tendon reflexes are 2/4 at the bilateral biceps, triceps, brachioradialis, patella and achilles.  Plantar responses are downgoing bilaterally. Gait and Station: The patient is able to ambulate without difficulty. The patient is able to heel toe walk  without any difficulty. The patient is able to ambulate in a tandem fashion. The patient is able to stand in the Romberg position.   MOVEMENT EXAM: Tremor:  There is *** tremor in the UE, noted most significantly with action.  The patient is *** able to draw Archimedes spirals without significant difficulty.  There is *** tremor at rest.  The patient is *** able to pour water from one glass to another without spilling it.  I have reviewed and interpreted the following labs independently   Chemistry      Component Value Date/Time   NA 135 03/31/2011 0900   K 4.5 03/31/2011 0900   CL 102 03/31/2011 0900   CO2 24 03/31/2011 0900   BUN 12 03/31/2011 0900   CREATININE 0.75 03/31/2011 0900      Component Value Date/Time   CALCIUM 9.9 03/31/2011 0900      Lab Results  Component Value Date   WBC 6.5 09/27/2017   HGB 15.6 09/27/2017   HCT 45.3 09/27/2017   MCV 94.6 09/27/2017   PLT 249 09/27/2017   No results found for: "TSH"    Total time spent on today's visit was ***60 minutes, including both face-to-face time and nonface-to-face time.  Time included that spent on review of records (prior notes available to me/labs/imaging if pertinent), discussing treatment and goals, answering patient's questions and coordinating care.  CC:  Mayra Neer, MD

## 2022-02-10 ENCOUNTER — Encounter: Payer: Self-pay | Admitting: Neurology

## 2022-02-10 ENCOUNTER — Ambulatory Visit (INDEPENDENT_AMBULATORY_CARE_PROVIDER_SITE_OTHER): Payer: No Typology Code available for payment source | Admitting: Neurology

## 2022-02-10 VITALS — BP 166/98 | HR 85 | Ht 71.0 in | Wt 245.8 lb

## 2022-02-10 DIAGNOSIS — F411 Generalized anxiety disorder: Secondary | ICD-10-CM | POA: Diagnosis not present

## 2022-02-10 DIAGNOSIS — R251 Tremor, unspecified: Secondary | ICD-10-CM

## 2022-02-10 DIAGNOSIS — Z789 Other specified health status: Secondary | ICD-10-CM

## 2022-02-10 NOTE — Patient Instructions (Signed)
We discussed potentially using propranolol for tremor.  This is a blood pressure drug that is used in tremor.  Make a follow up appointment with Dr. Clelia Croft to further discuss and I will send her a copy of my notes.  Essential Tremor A tremor is trembling or shaking that a person cannot control. Most tremors affect the hands or arms. Tremors can also affect the head, vocal cords, legs, and other parts of the body. Essential tremor is a tremor without a known cause. Usually, it occurs while a person is trying to perform an action. It tends to get worse gradually as a person ages. What are the causes? The cause of this condition is not known, but it often runs in families. What increases the risk? You are more likely to develop this condition if: You have a family member with essential tremor. You are 61 years of age or older. What are the signs or symptoms? The main sign of a tremor is a rhythmic shaking of certain parts of your body that is uncontrolled and unintentional. You may: Have difficulty eating with a spoon or fork. Have difficulty writing. Nod your head up and down or side to side. Have a quivering voice. The shaking may: Get worse over time. Come and go. Be more noticeable on one side of your body. Get worse due to stress, tiredness (fatigue), caffeine, and extreme heat or cold. How is this diagnosed? This condition may be diagnosed based on: Your symptoms and medical history. A physical exam. There is no single test to diagnose an essential tremor. However, your health care provider may order tests to rule out other causes of your condition. These may include: Blood and urine tests. Imaging studies of your brain, such as a CT scan or MRI. How is this treated? Treatment for essential tremor depends on the severity of the condition. Mild tremors may not need treatment if they do not affect your day-to-day life. Severe tremors may need to be treated using one or more of the  following options: Medicines. Injections of a substance called botulinum toxin. Procedures such as deep brain stimulation (DBS) implantation or MRI-guided ultrasound treatment. Lifestyle changes. Occupational or physical therapy. Follow these instructions at home: Lifestyle  Do not use any products that contain nicotine or tobacco. These products include cigarettes, chewing tobacco, and vaping devices, such as e-cigarettes. If you need help quitting, ask your health care provider. Limit your caffeine intake as told by your health care provider. Try to get 8 hours of sleep each night. Find ways to manage your stress that fit your lifestyle and personality. Consider trying meditation or yoga. Try to anticipate stressful situations and allow extra time to manage them. If you are struggling emotionally with the effects of your tremor, consider working with a mental health provider. General instructions Take over-the-counter and prescription medicines only as told by your health care provider. Avoid extreme heat and extreme cold. Keep all follow-up visits. This is important. Visits may include physical therapy visits. Where to find more information [employer] [facility] [employer] [facility] [employer]: [employer] Contact a health care provider if: You experience any changes in the location or intensity of your tremors. You start having a tremor after starting a new medicine. You have a tremor with other symptoms, such as: Numbness. Tingling. Pain. Weakness. Your tremor gets worse. Your tremor interferes with your daily life. You feel down, blue, or sad for at least 2 weeks in a row. Worrying about your tremor and  what other people think about you interferes with your everyday life functions, including relationships, work, or school. Summary Essential tremor is a tremor without a known cause. Usually, it occurs when you are trying to perform an action. You are more likely to  develop this condition if you have a family member with essential tremor. The main sign of a tremor is a rhythmic shaking of certain parts of your body that is uncontrolled and unintentional. Treatment for essential tremor depends on the severity of the condition. This information is not intended to replace advice given to you by your health care provider. Make sure you discuss any questions you have with your health care provider. Document Revised: 11/08/2020 Document Reviewed: 11/08/2020 Elsevier Patient Education  [place].

## 2022-06-18 ENCOUNTER — Other Ambulatory Visit: Payer: Self-pay | Admitting: Family Medicine

## 2022-06-18 DIAGNOSIS — K7400 Hepatic fibrosis, unspecified: Secondary | ICD-10-CM

## 2022-06-19 ENCOUNTER — Ambulatory Visit
Admission: RE | Admit: 2022-06-19 | Discharge: 2022-06-19 | Disposition: A | Discharge status: Home / Self Care | Payer: No Typology Code available for payment source | Source: Ambulatory Visit | Attending: Family Medicine | Admitting: Family Medicine

## 2022-06-19 DIAGNOSIS — K7400 Hepatic fibrosis, unspecified: Secondary | ICD-10-CM

## 2023-06-15 ENCOUNTER — Ambulatory Visit: Payer: Self-pay | Admitting: Family Medicine

## 2024-03-03 ENCOUNTER — Other Ambulatory Visit (HOSPITAL_BASED_OUTPATIENT_CLINIC_OR_DEPARTMENT_OTHER): Payer: Self-pay | Admitting: Family Medicine

## 2024-03-03 DIAGNOSIS — K7400 Hepatic fibrosis, unspecified: Secondary | ICD-10-CM
